# Patient Record
Sex: Female | Born: 1935 | Race: White | Hispanic: No | Marital: Married
Health system: Midwestern US, Community
[De-identification: ages and names within clinical notes are randomized; demographics above are authoritative.]

## PROBLEM LIST (undated history)

## (undated) DIAGNOSIS — F419 Anxiety disorder, unspecified: Secondary | ICD-10-CM

## (undated) DIAGNOSIS — G4700 Insomnia, unspecified: Secondary | ICD-10-CM

## (undated) DIAGNOSIS — F323 Major depressive disorder, single episode, severe with psychotic features: Secondary | ICD-10-CM

## (undated) DIAGNOSIS — F0390 Unspecified dementia without behavioral disturbance: Secondary | ICD-10-CM

## (undated) DIAGNOSIS — G40909 Epilepsy, unspecified, not intractable, without status epilepticus: Secondary | ICD-10-CM

---

## 2013-08-23 ENCOUNTER — Inpatient Hospital Stay
Admit: 2013-08-23 | Discharge: 2013-08-23 | Disposition: A | Discharge status: Home / Self Care | Attending: Emergency Medicine

## 2013-08-23 LAB — TROPONIN
Troponin I: <0.015 ng/mL (ref 0.000–0.045)
Troponin I: <0.015 ng/mL (ref 0.000–0.045)

## 2013-08-23 LAB — BASIC METABOLIC PANEL
Anion Gap: 11
BUN: 12 mg/dL (ref 7–25)
CO2: 25 mmol/L
Calcium: 9 mg/dL (ref 8.2–10.1)
Chloride: 91 mmol/L — ABNORMAL LOW (ref 98–109)
Creatinine: 1 mg/dL (ref 0.60–1.50)
EGFR IF NonAfrican American: 52.5 mL/min (ref >60)
Glucose: 106 mg/dL — ABNORMAL HIGH (ref 70–100)
Potassium: 3 mmol/L — ABNORMAL LOW (ref 3.5–5.1)
Sodium: 127 mmol/L — ABNORMAL LOW (ref 135–145)
eGFR African American: >60 mL/min (ref >60)

## 2013-08-23 LAB — CK-MB
CK-MB: 1.3 ng/mL (ref 0.5–3.6)
CK-MB: 1.4 ng/mL (ref 0.5–3.6)
Relative Index: 1.2 (ref 0.0–4.0)
Relative Index: 1.2 (ref 0.0–4.0)
Total CK: 113 U/L (ref 26–192)
Total CK: 115 U/L (ref 26–192)

## 2013-08-23 LAB — CBC
Hematocrit: 34.7 % — ABNORMAL LOW (ref 35.0–47.0)
Hemoglobin: 11.6 g/dl — ABNORMAL LOW (ref 11.7–16.0)
MCH: 32 pg (ref 26.0–34.0)
MCHC: 33.6 % (ref 32.0–36.0)
MCV: 95.3 fl (ref 79.0–98.0)
MPV: 7.4 fl (ref 7.4–10.4)
Platelets: 205 10*3/uL (ref 140–440)
RBC: 3.64 10*6/uL — ABNORMAL LOW (ref 3.80–5.20)
RDW: 12.9 % (ref 11.5–14.5)
WBC: 6 10*3/uL (ref 3.6–10.7)

## 2013-08-23 NOTE — ED Provider Notes (Signed)
PATIENT:          Angela Levine, Angela Levine        DOS:           08/23/2013  MR #:             [id number]             ACCOUNT #:     [id number]  DATE OF BIRTH:    Feb 13, 1926              AGE:           80      PROBLEM LIST:     chest pain: Entered Date: 23-Aug-2013 00:19, Entered By:  Carin HockINTERFACES,  INTERFACES    HISTORY OF PRESENT ILLNESS:    PERTINENT HISTORY OF PRESENT  ILLNESS. Patient seen with Dr.  Mindi Junkerussel.    Patient is an 80 year old woman with history of dementia who presents  with  chest pain this evening.  Per daughter, patient was complaining of  chest pain  and dyspnea.  She currently is symptom-free.  Daughter has not noticed  a fever,  vomiting, diarrhea.  She is concerned that patient has had decreased  appetite  and has lost several pounds in the last 2 weeks.  Of note, patient's  husband  recently passed away.    PERTINENT PAST/ FAMILY/SOCIAL HISTORY PMH: Dementia, hypertension,  hyperlipidemia, congestive heart failure  PSH: Last stress test in 2014, negative, hysterectomy,  cholecystectomy      PHYSICAL EXAM Vital signs unremarkable  General: No acute distress  HEENT: Neck supple, no JVD, nontender  Chest: Heart regular rate and rhythm, lungs clear to auscultation  bilaterally,  chest nontender  Abdomen: Soft, nontender, nondistended, no masses palpated  Back: Nontender, no CVA tenderness  Extremities: 2+ distal pulses, symmetric 1+ edema  Neurologic: Alert and oriented to self only, baseline, no focal  deficits    MEDICAL DECISION MAKING:    SIGNIFICANT FINDINGS/ED COURSE/MEDICAL DECISION MAKING/TREATMENT  PLAN Patient  with history of dementia and multiple comorbidities presents with  transient  chest pain.  Daughter gave her 324 mg of aspirin at home.  Electrocardiogram  showed sinus bradycardia with left ventricular hypertrophy and ST  changes that  were seen previously.  Lab work was significant for hemoglobin of  11.6, sodium  of 137, potassium of 3.  Troponin was negative.  Chest x-ray  appeared  unchanged  from previous.  On reevaluation, patient was pain-free.  Patient was  discussed  with Dr. Ladona Ridgelaylor, who knows her well.  It was determined that we would  check a  delta troponin and send her home with close followup.  Repeat cardiac  enzymes  were negative.  Patient's daughter is comfortable taking her home.  She was  counseled on symptoms that should prompt return.      DIAGNOSIS Chest pain, resolved  History of congestive heart failure      ADDITIONAL INFORMATION The Emergency Medicine attending physician  was present  in the Emergency Department, who reviewed case management, and  approved  evaluation/treatment.    COPIES SENT TO::     Ladona RidgelAYLOR, MATTHEW(PCP): [id number]    Electronic Signatures:  Farrel GordonUSSEL, Angie Hogg (MD)  (Signed 23-Aug-2013 06:16)   Co-Signer: PROBLEM LIST, HISTORY OF PRESENT ILLNESS, PHYSICAL EXAM,  MEDICAL  DECISION MAKING, DIAGNOSIS, Additional Infomation, Copies to be sent  to:  Milagros LollPALUMBO, CATERINA G (MD)  (Signed 23-Aug-2013 05:17)   Authored: PROBLEM LIST, HISTORY OF PRESENT ILLNESS, PHYSICAL EXAM,  MEDICAL  DECISION MAKING, DIAGNOSIS, Additional Infomation, Copies to be sent  to:      Last Updated: 23-Aug-2013 06:16 by Farrel GordonUSSEL, Estaban [place] (MD)            Please see T-Sheet, initial assessment, and physician orders for  further details.    Dictating Physician: Consuela Mimesaterina Palumbo, MD  Original Electronic Signature Date: 08/23/2013 03:36 A  CP  Document #: [id number]    cc:  Cleon DewMatthew S Taylor, MD       [phone]       [address]       [place] [id number]

## 2013-10-07 ENCOUNTER — Emergency Department: Payer: Self-pay | Admitting: Emergency Medicine

## 2013-12-05 ENCOUNTER — Emergency Department: Payer: Self-pay | Admitting: Emergency Medicine

## 2013-12-05 LAB — CBC
HCT: 35.1 % (ref 35.0–47.0)
HGB: 11.1 g/dL — AB (ref 12.0–16.0)
MCH: 30.6 pg (ref 26.0–34.0)
MCHC: 31.5 g/dL — ABNORMAL LOW (ref 32.0–36.0)
MCV: 97 fL (ref 80–100)
Platelet: 222 10*3/uL (ref 150–440)
RBC: 3.61 10*6/uL — ABNORMAL LOW (ref 3.80–5.20)
RDW: 13.6 % (ref 11.5–14.5)
WBC: 5.6 10*3/uL (ref 3.6–11.0)

## 2013-12-05 LAB — URINALYSIS, COMPLETE
Bilirubin,UR: NEGATIVE
Blood: NEGATIVE
Glucose,UR: NEGATIVE mg/dL (ref 0–75)
Hyaline Cast: 3
KETONE: NEGATIVE
LEUKOCYTE ESTERASE: NEGATIVE
Nitrite: NEGATIVE
PROTEIN: NEGATIVE
Ph: 6 (ref 4.5–8.0)
Specific Gravity: 1.005 (ref 1.003–1.030)
Squamous Epithelial: NONE SEEN
WBC UR: 11 /HPF (ref 0–5)

## 2013-12-05 LAB — COMPREHENSIVE METABOLIC PANEL
ALBUMIN: 3.6 g/dL (ref 3.4–5.0)
ALK PHOS: 70 U/L
AST: 21 U/L (ref 15–37)
Anion Gap: 9 (ref 7–16)
BILIRUBIN TOTAL: 0.5 mg/dL (ref 0.2–1.0)
BUN: 17 mg/dL (ref 7–18)
CO2: 26 mmol/L (ref 21–32)
CREATININE: 0.99 mg/dL (ref 0.60–1.30)
Calcium, Total: 8.9 mg/dL (ref 8.5–10.1)
Chloride: 104 mmol/L (ref 98–107)
EGFR (Non-African Amer.): 57 — ABNORMAL LOW
Glucose: 83 mg/dL (ref 65–99)
Osmolality: 278 (ref 275–301)
POTASSIUM: 3.2 mmol/L — AB (ref 3.5–5.1)
SGPT (ALT): 24 U/L
Sodium: 139 mmol/L (ref 136–145)
Total Protein: 6.6 g/dL (ref 6.4–8.2)

## 2013-12-05 LAB — TROPONIN I: Troponin-I: <0.02 ng/mL

## 2014-01-02 ENCOUNTER — Emergency Department: Payer: Self-pay | Admitting: Emergency Medicine

## 2014-02-05 ENCOUNTER — Emergency Department: Payer: Self-pay | Admitting: Emergency Medicine

## 2014-02-05 LAB — COMPREHENSIVE METABOLIC PANEL
ALT: 30 U/L
ANION GAP: 6 — AB (ref 7–16)
Albumin: 3 g/dL — ABNORMAL LOW (ref 3.4–5.0)
Alkaline Phosphatase: 68 U/L
BILIRUBIN TOTAL: 0.4 mg/dL (ref 0.2–1.0)
BUN: 19 mg/dL — AB (ref 7–18)
CHLORIDE: 105 mmol/L (ref 98–107)
CO2: 28 mmol/L (ref 21–32)
Calcium, Total: 8.6 mg/dL (ref 8.5–10.1)
Creatinine: 0.99 mg/dL (ref 0.60–1.30)
EGFR (African American): >60
EGFR (Non-African Amer.): 57 — ABNORMAL LOW
Glucose: 91 mg/dL (ref 65–99)
Osmolality: 279 (ref 275–301)
Potassium: 4.2 mmol/L (ref 3.5–5.1)
SGOT(AST): 47 U/L — ABNORMAL HIGH (ref 15–37)
Sodium: 139 mmol/L (ref 136–145)
Total Protein: 6.2 g/dL — ABNORMAL LOW (ref 6.4–8.2)

## 2014-02-05 LAB — CBC WITH DIFFERENTIAL/PLATELET
BASOS ABS: 0 10*3/uL (ref 0.0–0.1)
BASOS PCT: 0.6 %
EOS ABS: 0 10*3/uL (ref 0.0–0.7)
EOS PCT: 0.8 %
HCT: 32.5 % — AB (ref 35.0–47.0)
HGB: 10.4 g/dL — ABNORMAL LOW (ref 12.0–16.0)
LYMPHS ABS: 0.7 10*3/uL — AB (ref 1.0–3.6)
Lymphocyte %: 13.9 %
MCH: 30.8 pg (ref 26.0–34.0)
MCHC: 32.1 g/dL (ref 32.0–36.0)
MCV: 96 fL (ref 80–100)
Monocyte #: 0.5 x10 3/mm (ref 0.2–0.9)
Monocyte %: 9.7 %
Neutrophil #: 3.9 10*3/uL (ref 1.4–6.5)
Neutrophil %: 75 %
PLATELETS: 223 10*3/uL (ref 150–440)
RBC: 3.38 10*6/uL — ABNORMAL LOW (ref 3.80–5.20)
RDW: 14.4 % (ref 11.5–14.5)
WBC: 5.2 10*3/uL (ref 3.6–11.0)

## 2014-02-11 ENCOUNTER — Emergency Department: Payer: Self-pay | Admitting: Emergency Medicine

## 2014-02-11 LAB — URINALYSIS, COMPLETE
BLOOD: NEGATIVE
Bilirubin,UR: NEGATIVE
Glucose,UR: NEGATIVE mg/dL (ref 0–75)
KETONE: NEGATIVE
NITRITE: NEGATIVE
PH: 5 (ref 4.5–8.0)
RBC,UR: <1 /HPF (ref 0–5)
SQUAMOUS EPITHELIAL: NONE SEEN
Specific Gravity: 1.016 (ref 1.003–1.030)
WBC UR: 18 /HPF (ref 0–5)

## 2014-02-14 LAB — URINE CULTURE

## 2014-03-01 ENCOUNTER — Emergency Department: Payer: Self-pay | Admitting: Emergency Medicine

## 2014-03-01 LAB — CBC
HCT: 33.5 % — ABNORMAL LOW
HGB: 11 g/dL — ABNORMAL LOW
MCH: 31.4 pg
MCHC: 32.8 g/dL
MCV: 96 fL
Platelet: 194 10*3/uL
RBC: 3.5 X10 6/mm 3 — ABNORMAL LOW
RDW: 15.1 % — ABNORMAL HIGH
WBC: 4.9 10*3/uL

## 2014-03-01 LAB — COMPREHENSIVE METABOLIC PANEL
Albumin: 3.3 g/dL — ABNORMAL LOW (ref 3.4–5.0)
Alkaline Phosphatase: 77 U/L
Anion Gap: 7 (ref 7–16)
BILIRUBIN TOTAL: 0.5 mg/dL (ref 0.2–1.0)
BUN: 22 mg/dL — ABNORMAL HIGH (ref 7–18)
Calcium, Total: 8.9 mg/dL (ref 8.5–10.1)
Chloride: 104 mmol/L (ref 98–107)
Co2: 29 mmol/L (ref 21–32)
Creatinine: 1.01 mg/dL (ref 0.60–1.30)
EGFR (Non-African Amer.): 55 — ABNORMAL LOW
GLUCOSE: 96 mg/dL (ref 65–99)
Osmolality: 283 (ref 275–301)
Potassium: 3.6 mmol/L (ref 3.5–5.1)
SGOT(AST): 32 U/L (ref 15–37)
SGPT (ALT): 28 U/L
Sodium: 140 mmol/L (ref 136–145)
TOTAL PROTEIN: 6.4 g/dL (ref 6.4–8.2)

## 2014-10-27 ENCOUNTER — Encounter: Payer: Self-pay | Admitting: Emergency Medicine

## 2014-10-27 ENCOUNTER — Emergency Department: Payer: Medicare Other

## 2014-10-27 ENCOUNTER — Inpatient Hospital Stay
Admission: EM | Admit: 2014-10-27 | Discharge: 2014-10-31 | DRG: 481 | Disposition: A | Discharge status: Skilled Nursing Facility | Payer: Medicare Other | Attending: Internal Medicine | Admitting: Internal Medicine

## 2014-10-27 DIAGNOSIS — F0280 Dementia in other diseases classified elsewhere without behavioral disturbance: Secondary | ICD-10-CM | POA: Diagnosis present

## 2014-10-27 DIAGNOSIS — D62 Acute posthemorrhagic anemia: Secondary | ICD-10-CM | POA: Diagnosis not present

## 2014-10-27 DIAGNOSIS — S72009A Fracture of unspecified part of neck of unspecified femur, initial encounter for closed fracture: Secondary | ICD-10-CM

## 2014-10-27 DIAGNOSIS — Z880 Allergy status to penicillin: Secondary | ICD-10-CM | POA: Diagnosis not present

## 2014-10-27 DIAGNOSIS — R339 Retention of urine, unspecified: Secondary | ICD-10-CM | POA: Diagnosis not present

## 2014-10-27 DIAGNOSIS — M25551 Pain in right hip: Secondary | ICD-10-CM | POA: Diagnosis present

## 2014-10-27 DIAGNOSIS — Z7982 Long term (current) use of aspirin: Secondary | ICD-10-CM

## 2014-10-27 DIAGNOSIS — G4700 Insomnia, unspecified: Secondary | ICD-10-CM | POA: Diagnosis present

## 2014-10-27 DIAGNOSIS — G40909 Epilepsy, unspecified, not intractable, without status epilepticus: Secondary | ICD-10-CM | POA: Diagnosis present

## 2014-10-27 DIAGNOSIS — F29 Unspecified psychosis not due to a substance or known physiological condition: Secondary | ICD-10-CM | POA: Diagnosis present

## 2014-10-27 DIAGNOSIS — F419 Anxiety disorder, unspecified: Secondary | ICD-10-CM | POA: Diagnosis present

## 2014-10-27 DIAGNOSIS — R7989 Other specified abnormal findings of blood chemistry: Secondary | ICD-10-CM | POA: Diagnosis not present

## 2014-10-27 DIAGNOSIS — S72001A Fracture of unspecified part of neck of right femur, initial encounter for closed fracture: Secondary | ICD-10-CM

## 2014-10-27 DIAGNOSIS — I10 Essential (primary) hypertension: Secondary | ICD-10-CM | POA: Diagnosis present

## 2014-10-27 DIAGNOSIS — Z79899 Other long term (current) drug therapy: Secondary | ICD-10-CM

## 2014-10-27 DIAGNOSIS — W010XXA Fall on same level from slipping, tripping and stumbling without subsequent striking against object, initial encounter: Secondary | ICD-10-CM | POA: Diagnosis present

## 2014-10-27 DIAGNOSIS — G309 Alzheimer's disease, unspecified: Secondary | ICD-10-CM | POA: Diagnosis present

## 2014-10-27 DIAGNOSIS — F329 Major depressive disorder, single episode, unspecified: Secondary | ICD-10-CM | POA: Diagnosis present

## 2014-10-27 DIAGNOSIS — E876 Hypokalemia: Secondary | ICD-10-CM | POA: Diagnosis not present

## 2014-10-27 DIAGNOSIS — R748 Abnormal levels of other serum enzymes: Secondary | ICD-10-CM | POA: Diagnosis not present

## 2014-10-27 DIAGNOSIS — S72141A Displaced intertrochanteric fracture of right femur, initial encounter for closed fracture: Secondary | ICD-10-CM | POA: Diagnosis present

## 2014-10-27 HISTORY — DX: Insomnia, unspecified: G47.00

## 2014-10-27 HISTORY — DX: Major depressive disorder, single episode, severe with psychotic features: F32.3

## 2014-10-27 HISTORY — DX: Anxiety disorder, unspecified: F41.9

## 2014-10-27 HISTORY — DX: Epilepsy, unspecified, not intractable, without status epilepticus: G40.909

## 2014-10-27 HISTORY — DX: Unspecified dementia, unspecified severity, without behavioral disturbance, psychotic disturbance, mood disturbance, and anxiety: F03.90

## 2014-10-27 LAB — BASIC METABOLIC PANEL
ANION GAP: 7 (ref 5–15)
BUN: 17 mg/dL (ref 6–20)
CO2: 28 mmol/L (ref 22–32)
Calcium: 8.9 mg/dL (ref 8.9–10.3)
Chloride: 102 mmol/L (ref 101–111)
Creatinine, Ser: 0.91 mg/dL (ref 0.44–1.00)
GFR, EST NON AFRICAN AMERICAN: 55 mL/min — AB (ref >60)
Glucose, Bld: 141 mg/dL — ABNORMAL HIGH (ref 65–99)
POTASSIUM: 3.6 mmol/L (ref 3.5–5.1)
SODIUM: 137 mmol/L (ref 135–145)

## 2014-10-27 LAB — PROTIME-INR
INR: 1
PROTHROMBIN TIME: 13.4 s (ref 11.4–15.0)

## 2014-10-27 LAB — CBC WITH DIFFERENTIAL/PLATELET
BASOS ABS: 0 10*3/uL (ref 0–0.1)
Basophils Relative: 0 %
EOS ABS: 0 10*3/uL (ref 0–0.7)
EOS PCT: 1 %
HCT: 32.2 % — ABNORMAL LOW (ref 35.0–47.0)
Hemoglobin: 10.7 g/dL — ABNORMAL LOW (ref 12.0–16.0)
LYMPHS PCT: 5 %
Lymphs Abs: 0.4 10*3/uL — ABNORMAL LOW (ref 1.0–3.6)
MCH: 31.8 pg (ref 26.0–34.0)
MCHC: 33.1 g/dL (ref 32.0–36.0)
MCV: 96 fL (ref 80.0–100.0)
Monocytes Absolute: 0.5 10*3/uL (ref 0.2–0.9)
Monocytes Relative: 7 %
Neutro Abs: 6.7 10*3/uL — ABNORMAL HIGH (ref 1.4–6.5)
Neutrophils Relative %: 87 %
PLATELETS: 148 10*3/uL — AB (ref 150–440)
RBC: 3.36 MIL/uL — AB (ref 3.80–5.20)
RDW: 13.5 % (ref 11.5–14.5)
WBC: 7.7 10*3/uL (ref 3.6–11.0)

## 2014-10-27 LAB — APTT: APTT: 26 s (ref 24–36)

## 2014-10-27 LAB — TROPONIN I

## 2014-10-27 MED ORDER — ONDANSETRON HCL 4 MG PO TABS: 4.0000 mg | ORAL_TABLET | Freq: Four times a day (QID) | ORAL | Status: DC | PRN

## 2014-10-27 MED ORDER — POLYETHYLENE GLYCOL 3350 17 G PO PACK: 17.0000 g | PACK | Freq: Every day | ORAL | Status: DC | PRN

## 2014-10-27 MED ORDER — ACETAMINOPHEN 650 MG RE SUPP: 650.0000 mg | Freq: Four times a day (QID) | RECTAL | Status: DC | PRN

## 2014-10-27 MED ORDER — LORAZEPAM 0.5 MG PO TABS
0.5000 mg | ORAL_TABLET | Freq: Two times a day (BID) | ORAL | Status: DC
  Administered 2014-10-28 – 2014-10-31 (×7): 0.5 mg via ORAL
  Filled 2014-10-27 (×7): qty 1

## 2014-10-27 MED ORDER — ACETAMINOPHEN 325 MG PO TABS
650.0000 mg | ORAL_TABLET | Freq: Four times a day (QID) | ORAL | Status: DC | PRN
  Administered 2014-10-29: 650 mg via ORAL
  Filled 2014-10-27: qty 2

## 2014-10-27 MED ORDER — SODIUM CHLORIDE 0.9 % IV SOLN
INTRAVENOUS | Status: DC
  Administered 2014-10-27 – 2014-10-28 (×3): via INTRAVENOUS

## 2014-10-27 MED ORDER — RISPERIDONE 0.5 MG PO TABS
0.2500 mg | ORAL_TABLET | Freq: Two times a day (BID) | ORAL | Status: DC
  Administered 2014-10-28 – 2014-10-31 (×7): 0.25 mg via ORAL
  Filled 2014-10-27 (×10): qty 1

## 2014-10-27 MED ORDER — PRAVASTATIN SODIUM 20 MG PO TABS
40.0000 mg | ORAL_TABLET | Freq: Every day | ORAL | Status: DC
  Administered 2014-10-28 – 2014-10-30 (×4): 40 mg via ORAL
  Filled 2014-10-27 (×4): qty 2

## 2014-10-27 MED ORDER — MORPHINE SULFATE (PF) 4 MG/ML IV SOLN
4.0000 mg | Freq: Once | INTRAVENOUS | Status: AC
  Administered 2014-10-27: 4 mg via INTRAVENOUS
  Filled 2014-10-27: qty 1

## 2014-10-27 MED ORDER — MIRTAZAPINE 15 MG PO TABS
7.5000 mg | ORAL_TABLET | Freq: Every day | ORAL | Status: DC
  Administered 2014-10-28 – 2014-10-30 (×4): 7.5 mg via ORAL
  Filled 2014-10-27 (×4): qty 1

## 2014-10-27 MED ORDER — MELATONIN 3 MG PO TABS: 3.0000 mg | ORAL_TABLET | Freq: Every day | ORAL | Status: DC

## 2014-10-27 MED ORDER — VITAMIN D 1000 UNITS PO TABS
2000.0000 [IU] | ORAL_TABLET | Freq: Every day | ORAL | Status: DC
  Administered 2014-10-29 – 2014-10-31 (×3): 2000 [IU] via ORAL
  Filled 2014-10-27 (×3): qty 2

## 2014-10-27 MED ORDER — MORPHINE SULFATE (PF) 2 MG/ML IV SOLN
2.0000 mg | INTRAVENOUS | Status: DC | PRN
  Administered 2014-10-28 (×4): 2 mg via INTRAVENOUS
  Filled 2014-10-27 (×4): qty 1

## 2014-10-27 MED ORDER — DOCUSATE SODIUM 100 MG PO CAPS
100.0000 mg | ORAL_CAPSULE | Freq: Two times a day (BID) | ORAL | Status: DC
  Administered 2014-10-27 – 2014-10-29 (×3): 100 mg via ORAL
  Filled 2014-10-27 (×3): qty 1

## 2014-10-27 MED ORDER — SODIUM CHLORIDE 0.9 % IV BOLUS (SEPSIS)
250.0000 mL | Freq: Once | INTRAVENOUS | Status: AC
  Administered 2014-10-27: 250 mL via INTRAVENOUS

## 2014-10-27 MED ORDER — DIVALPROEX SODIUM 125 MG PO CSDR
125.0000 mg | DELAYED_RELEASE_CAPSULE | Freq: Every day | ORAL | Status: DC
  Administered 2014-10-29 – 2014-10-31 (×3): 125 mg via ORAL
  Filled 2014-10-27 (×3): qty 1

## 2014-10-27 MED ORDER — VITAMIN C 500 MG PO TABS
500.0000 mg | ORAL_TABLET | Freq: Every day | ORAL | Status: DC
  Administered 2014-10-29 – 2014-10-31 (×3): 500 mg via ORAL
  Filled 2014-10-27 (×3): qty 1

## 2014-10-27 MED ORDER — OXYCODONE HCL 5 MG PO TABS
5.0000 mg | ORAL_TABLET | ORAL | Status: DC | PRN
  Administered 2014-10-28: 5 mg via ORAL
  Filled 2014-10-27: qty 1

## 2014-10-27 MED ORDER — ONDANSETRON HCL 4 MG/2ML IJ SOLN
4.0000 mg | Freq: Four times a day (QID) | INTRAMUSCULAR | Status: DC | PRN
  Administered 2014-10-28: 4 mg via INTRAVENOUS

## 2014-10-27 MED ORDER — DIVALPROEX SODIUM 125 MG PO CSDR
250.0000 mg | DELAYED_RELEASE_CAPSULE | Freq: Every day | ORAL | Status: DC
  Administered 2014-10-28 – 2014-10-30 (×4): 250 mg via ORAL
  Filled 2014-10-27 (×4): qty 2

## 2014-10-27 MED ORDER — ONDANSETRON HCL 4 MG/2ML IJ SOLN
4.0000 mg | Freq: Once | INTRAMUSCULAR | Status: AC
  Administered 2014-10-27: 4 mg via INTRAVENOUS
  Filled 2014-10-27: qty 2

## 2014-10-27 NOTE — H&P (Signed)
[facility] [place] [facility]   PATIENT NAME: Kristen Deleon    MR#:  [id number]  DATE OF BIRTH:  [birth year]   DATE OF ADMISSION:  10/27/2014  PRIMARY CARE PHYSICIAN: Danella Penton., MD   REQUESTING/REFERRING PHYSICIAN: Schaevitz  CHIEF COMPLAINT:   Chief Complaint  Patient presents with  . Fall    HISTORY OF PRESENT ILLNESS:  Kristen Deleon  is a 81 y.o. female with a known history of dementia presenting after mechanical fall. She is unable to provide reliable information given mental status/medical condition. History obtained from emergency department staff. She fell at her nursing facility [facility] while she was in the bathroom. Fortunately, she had no head trauma or loss of consciousness. She did have immediate pain and right hip described only as "pain" she was found to have a right intertrochanteric hip fracture. Once again unable to provide meaningful information given mental status  PAST MEDICAL HISTORY:   Past Medical History  Diagnosis Date  . Dementia   . Depressive psychosis   . Anxiety   . Insomnia   . Epilepsia     PAST SURGICAL HISTORY:  History reviewed. No pertinent past surgical history.  SOCIAL HISTORY:   Social History  Substance Use Topics  . Smoking status: Never Smoker   . Smokeless tobacco: Not on file  . Alcohol Use: No    FAMILY HISTORY:   Family History  Problem Relation Age of Onset  . Family history unknown: Yes   unable to obtain given patient's mental status  DRUG ALLERGIES:   Allergies  Allergen Reactions  . Penicillins Other (See Comments)    Unknown      REVIEW OF SYSTEMS:   Unreliable given patient mental status/medical condition    MEDICATIONS AT HOME:   Prior to Admission medications   Not on File      VITAL SIGNS:  Blood pressure 154/67, pulse 70, temperature 98.5 F (36.9 C), temperature source Oral, resp. rate 16, height 5\' 4"  (1.626 m), weight 106 lb 7.7 oz (48.3  kg), SpO2 97 %.  PHYSICAL EXAMINATION:  VITAL SIGNS: Filed Vitals:   10/27/14 2008  BP: 154/67  Pulse: 70  Temp: 98.5 F (36.9 C)  Resp: 16   GENERAL:81 y.o.female currently in minimal acute distress. Given pain HEAD: Normocephalic, atraumatic.  EYES: Pupils equal, round, reactive to light. Extraocular muscles intact. No scleral icterus.  MOUTH: Moist mucosal membrane. Dentition intact. No abscess noted.  EAR, NOSE, THROAT: Clear without exudates. No external lesions.  NECK: Supple. No thyromegaly. No nodules. No JVD.  PULMONARY: Clear to ascultation, without wheeze rails or rhonci. No use of accessory muscles, Good respiratory effort. good air entry bilaterally CHEST: Nontender to palpation.  CARDIOVASCULAR: S1 and S2. Regular rate and rhythm. No murmurs, rubs, or gallops. No edema. Pedal pulses 2+ bilaterally.  GASTROINTESTINAL: Soft, nontender, nondistended. No masses. Positive bowel sounds. No hepatosplenomegaly.  MUSCULOSKELETAL: No swelling, clubbing, or edema. Limited Range of motion right lower extremity given fracture, extremity rotated, shortened  NEUROLOGIC: Cranial nerves II through XII are intact. No gross focal neurological deficits. Sensation intact. Reflexes intact.  SKIN: No ulceration, lesions, rashes, or cyanosis. Skin warm and dry. Turgor intact.  PSYCHIATRIC: Mood, affect flattened. The patient is awake, alert oriented to self however pleasantly conversant. Insight, judgment poor.    LABORATORY PANEL:   CBC  Recent Labs Lab 10/27/14 2032  WBC 7.7  HGB 10.7*  HCT 32.2*  PLT 148*   ------------------------------------------------------------------------------------------------------------------  Chemistries   Recent Labs Lab 10/27/14 2032  NA 137  K 3.6  CL 102  CO2 28  GLUCOSE 141*  BUN 17  CREATININE 0.91  CALCIUM 8.9    ------------------------------------------------------------------------------------------------------------------  Cardiac Enzymes  Recent Labs Lab 10/27/14 2032  TROPONINI <0.03   ------------------------------------------------------------------------------------------------------------------  RADIOLOGY:  Dg Chest 1 View  10/27/2014   CLINICAL DATA:  Status post 2 falls, with concern for chest injury. Initial encounter.  EXAM: CHEST  1 VIEW  COMPARISON:  Chest radiograph performed 02/11/2014  FINDINGS: The lungs are well-aerated. Vascular congestion is noted, with chronically increased interstitial markings. There is no evidence of pleural effusion or pneumothorax.  The cardiomediastinal silhouette is mildly enlarged. No acute osseous abnormalities are seen.  IMPRESSION: Vascular congestion and mild cardiomegaly, with chronically increased interstitial markings. No displaced rib fracture seen.   Electronically Signed   By: Roanna Raider M.D.   On: 10/27/2014 21:13   Ct Head Wo Contrast  10/27/2014   CLINICAL DATA:  Patient fell at nursing home twice today dementia  EXAM: CT HEAD WITHOUT CONTRAST  CT CERVICAL SPINE WITHOUT CONTRAST  TECHNIQUE: Multidetector CT imaging of the head and cervical spine was performed following the standard protocol without intravenous contrast. Multiplanar CT image reconstructions of the cervical spine were also generated.  COMPARISON:  03/01/2014  FINDINGS: CT HEAD FINDINGS  Stable hyperostosis of the calvarium. Severe diffuse atrophy. No hemorrhage infarct mass hydrocephalus or extra-axial fluid. No skull fracture.  CT CERVICAL SPINE FINDINGS  No soft tissue abnormalities. Normal alignment. Mild degenerative disc disease throughout the cervical spine. No fracture. Lung apices clear.  IMPRESSION: No acute abnormalities.  Chronic intracranial atrophy.  No evidence of cervical spine fracture.   Electronically Signed   By: Esperanza Heir M.D.   On: 10/27/2014 21:26    Ct Cervical Spine Wo Contrast  10/27/2014   CLINICAL DATA:  Patient fell at nursing home twice today dementia  EXAM: CT HEAD WITHOUT CONTRAST  CT CERVICAL SPINE WITHOUT CONTRAST  TECHNIQUE: Multidetector CT imaging of the head and cervical spine was performed following the standard protocol without intravenous contrast. Multiplanar CT image reconstructions of the cervical spine were also generated.  COMPARISON:  03/01/2014  FINDINGS: CT HEAD FINDINGS  Stable hyperostosis of the calvarium. Severe diffuse atrophy. No hemorrhage infarct mass hydrocephalus or extra-axial fluid. No skull fracture.  CT CERVICAL SPINE FINDINGS  No soft tissue abnormalities. Normal alignment. Mild degenerative disc disease throughout the cervical spine. No fracture. Lung apices clear.  IMPRESSION: No acute abnormalities.  Chronic intracranial atrophy.  No evidence of cervical spine fracture.   Electronically Signed   By: Esperanza Heir M.D.   On: 10/27/2014 21:26   Dg Hip Unilat With Pelvis 2-3 Views Right  10/27/2014   CLINICAL DATA:  Larey Seat twice today at her nursing home.  EXAM: DG HIP (WITH OR WITHOUT PELVIS) 2-3V RIGHT  COMPARISON:  None.  FINDINGS: There is an intertrochanteric right hip fracture with mild comminution and moderate varus angulation. There is no dislocation.  IMPRESSION: Intertrochanteric right hip fracture   Electronically Signed   By: Ellery Plunk M.D.   On: 10/27/2014 21:13    EKG:   Orders placed or performed during the hospital encounter of 10/27/14  . ED EKG  . ED EKG    IMPRESSION AND PLAN:   81 year old Caucasian female history of dementia presenting after mechanical fall.  1. Preoperative evaluation for right intertrochanteric hip fracture: She should be considered a moderate risk for  moderate risk surgery from the cardiac standpoint. No active signs or symptoms suggestive of congestive heart failure, significant valvular dysfunction, significant arrhythmia. Her METs to be considered  less than 4 given generalized dismobility. No further testing or interventions required prior to surgery. Place nothing by mouth past midnight, provide pain medications, initiate bowel regimen 2. Epilepsy: Continue with Depakote 3. Venous thromboembolism prophylactic: SCDs    All the records are reviewed and case discussed with ED provider. Management plans discussed with the patient, family and they are in agreement.  CODE STATUS: Full  TOTAL TIME TAKING CARE OF THIS PATIENT: 35 minutes.    Hower,  Mardi Mainland.D on 10/27/2014 at 9:53 PM  Between 7am to 6pm - Pager - [phone]  After 6pm: House Pager: - [phone]  [facility]  Office  [phone]  CC: Primary care physician; Danella Penton., MD

## 2014-10-27 NOTE — ED Notes (Signed)
Pt presents to ED via EMS from [facility] with c/o of acute fall, with right hip pain. EMS states pt had x2 fall episodes today. EMS states pt was located in bathroom when pt experienced presenting sx. Pt arrived to ER with notable right side shortened leg, externally rotated. EMS states pt has an extensive hx of dementia, with behavior disturbance. Pt arrived to ER rating pain a 10/10 to affected side. MD at bedside, completing medical evaluation.

## 2014-10-27 NOTE — ED Provider Notes (Signed)
[facility] Emergency Department Provider Note  ____________________________________________  Time seen: Seen upon arrival to the emergency department   I have reviewed the triage vital signs and the nursing notes.   HISTORY  Chief Complaint Fall    HPI Kristen Deleon is a 81 y.o. female with a history of dementia from a skilled nursing facility who presents today with a fall 2 today. Unclear if mechanical. Patient does not recall specifics of the fall but says she tripped and fell in her bathroom. However, does report right-sided hip pain. EMS was only able to obtain a limited history from the patient's nursing home. Unclear if loss of consciousness or any head trauma. Patient is denying pain or back or neck or head at this time.   Past Medical History  Diagnosis Date  . Dementia   . Depressive psychosis   . Anxiety   . Insomnia   . Epilepsia     There are no active problems to display for this patient.   History reviewed. No pertinent past surgical history.  No current outpatient prescriptions on file.  Allergies Penicillins  No family history on file.  Social History Social History  Substance Use Topics  . Smoking status: Never Smoker   . Smokeless tobacco: None  . Alcohol Use: No    Review of Systems Constitutional: No fever/chills Eyes: No visual changes. ENT: No sore throat. Cardiovascular: Denies chest pain. Respiratory: Denies shortness of breath. Gastrointestinal: No abdominal pain.  No nausea, no vomiting.  No diarrhea.  No constipation. Genitourinary: Negative for dysuria. Musculoskeletal: Negative for back pain. Skin: Negative for rash. Neurological: Negative for headaches, focal weakness or numbness.  10-point ROS otherwise negative.  ____________________________________________   PHYSICAL EXAM:  VITAL SIGNS: ED Triage Vitals  Enc Vitals Group     BP 10/27/14 2008 154/67 mmHg     Pulse Rate 10/27/14 2008 70     Resp 10/27/14 2008 16     Temp 10/27/14 2008 98.5 F (36.9 C)     Temp Source 10/27/14 2008 Oral     SpO2 10/27/14 2008 97 %     Weight 10/27/14 2008 106 lb 7.7 oz (48.3 kg)     Height 10/27/14 2008 5\' 4"  (1.626 m)     Head Cir --      Peak Flow --      Pain Score 10/27/14 2015 10     Pain Loc --      Pain Edu? --      Excl. in GC? --     Constitutional: Alert and oriented. Well appearing and in no acute distress. Eyes: Conjunctivae are normal. PERRL. EOMI. Head: Atraumatic. Nose: No congestion/rhinnorhea. Mouth/Throat: Mucous membranes are moist.  Oropharynx non-erythematous. Neck: No stridor.  No tenderness, step-off or deformity to the C-spine. Cardiovascular: Normal rate, regular rhythm. Grossly normal heart sounds.  Good peripheral circulation. Respiratory: Normal respiratory effort.  No retractions. Lungs CTAB. Gastrointestinal: Soft and nontender. No distention. No abdominal bruits. No CVA tenderness. Musculoskeletal: Right hip tenderness to palpation anteriorly and laterally. There is no overlying ecchymosis. The right leg is shortened and externally rotated. The patient is able to range her toes bilaterally and is sensate to the distal aspects of the extremities. There is also a intact bilateral and equal dorsalis pedis pulse. No tenderness to the thoracic or lumbar spines. Neurologic:  Normal speech and language. No gross focal neurologic deficits are appreciated. Skin:  Skin is warm, dry and intact. No rash noted. Psychiatric:  Mood and affect are normal. Speech and behavior are normal.  ____________________________________________   LABS (all labs ordered are listed, but only abnormal results are displayed)  Labs Reviewed  CBC WITH DIFFERENTIAL/PLATELET - Abnormal; Notable for the following:    RBC 3.36 (*)    Hemoglobin 10.7 (*)    HCT 32.2 (*)    Platelets 148 (*)    Neutro Abs 6.7 (*)    Lymphs Abs 0.4 (*)    All other components within normal limits  BASIC  METABOLIC PANEL  PROTIME-INR  APTT  TROPONIN I   ____________________________________________  EKG  ED ECG REPORT I, Arelia Longest, the attending physician, personally viewed and interpreted this ECG.   Date: 10/27/2014  EKG Time: 2026  Rate: 67  Rhythm: normal sinus rhythm  Axis: Left axis deviation  Intervals:Normal intervals  ST&T Change: Biphasic T-wave in aVL which is unchanged from previous. No ST elevations or depressions.  ____________________________________________  RADIOLOGY  CT head and C-spine without any acute findings. Right hip x-ray with intertrochanteric fracture. Vascular congestion and mild cardiomegaly on chest x-ray. I personally reviewed these images. ____________________________________________   PROCEDURES    ____________________________________________   INITIAL IMPRESSION / ASSESSMENT AND PLAN / ED COURSE  Pertinent labs & imaging results that were available during my care of the patient were reviewed by me and considered in my medical decision making (see chart for details).  ----------------------------------------- 9:49 PM on 10/27/2014 -----------------------------------------  Patient just received morphine. We'll reevaluate for reduction of pain. However, resting without distress at this time. Aware of right hip fracture. Discussed case with Dr. Clint Guy who will admit the patient to the hospital. Also discussed with Dr. Joice Lofts of orthopedics. ____________________________________________   FINAL CLINICAL IMPRESSION(S) / ED DIAGNOSES  Acute fall, likely mechanical. Acute right-sided hip fracture. Initial visit.    [name], MD 10/27/14 2150

## 2014-10-28 ENCOUNTER — Inpatient Hospital Stay: Payer: Medicare Other | Admitting: Anesthesiology

## 2014-10-28 ENCOUNTER — Encounter
Admission: EM | Disposition: A | Discharge status: Skilled Nursing Facility | Payer: Self-pay | Source: Home / Self Care | Attending: Internal Medicine

## 2014-10-28 ENCOUNTER — Inpatient Hospital Stay: Payer: Medicare Other

## 2014-10-28 HISTORY — PX: INTRAMEDULLARY (IM) NAIL INTERTROCHANTERIC: SHX5875

## 2014-10-28 LAB — CBC
HCT: 28 % — ABNORMAL LOW (ref 35.0–47.0)
Hemoglobin: 9.4 g/dL — ABNORMAL LOW (ref 12.0–16.0)
MCH: 31.8 pg (ref 26.0–34.0)
MCHC: 33.4 g/dL (ref 32.0–36.0)
MCV: 95.3 fL (ref 80.0–100.0)
PLATELETS: 141 10*3/uL — AB (ref 150–440)
RBC: 2.94 MIL/uL — ABNORMAL LOW (ref 3.80–5.20)
RDW: 13.7 % (ref 11.5–14.5)
WBC: 9.2 10*3/uL (ref 3.6–11.0)

## 2014-10-28 LAB — BASIC METABOLIC PANEL
Anion gap: 6 (ref 5–15)
BUN: 15 mg/dL (ref 6–20)
CALCIUM: 8.6 mg/dL — AB (ref 8.9–10.3)
CHLORIDE: 104 mmol/L (ref 101–111)
CO2: 27 mmol/L (ref 22–32)
CREATININE: 0.76 mg/dL (ref 0.44–1.00)
GFR calc Af Amer: >60 mL/min (ref >60)
GFR calc non Af Amer: >60 mL/min (ref >60)
GLUCOSE: 125 mg/dL — AB (ref 65–99)
Potassium: 3.8 mmol/L (ref 3.5–5.1)
Sodium: 137 mmol/L (ref 135–145)

## 2014-10-28 LAB — ABO/RH: ABO/RH(D): B POS

## 2014-10-28 LAB — MRSA PCR SCREENING: MRSA BY PCR: NEGATIVE

## 2014-10-28 SURGERY — FIXATION, FRACTURE, INTERTROCHANTERIC, WITH INTRAMEDULLARY ROD
Anesthesia: Regional | Laterality: Right

## 2014-10-28 MED ORDER — FENTANYL CITRATE (PF) 100 MCG/2ML IJ SOLN: 25.0000 ug | INTRAMUSCULAR | Status: DC | PRN

## 2014-10-28 MED ORDER — PROPOFOL 10 MG/ML IV BOLUS
INTRAVENOUS | Status: DC | PRN
  Administered 2014-10-28: 100 mg via INTRAVENOUS

## 2014-10-28 MED ORDER — SUCCINYLCHOLINE CHLORIDE 20 MG/ML IJ SOLN
INTRAMUSCULAR | Status: DC | PRN
Start: 2014-10-28 — End: 2014-10-28
  Administered 2014-10-28: 50 mg via INTRAVENOUS

## 2014-10-28 MED ORDER — MIDAZOLAM HCL 2 MG/2ML IJ SOLN
INTRAMUSCULAR | Status: DC | PRN
  Administered 2014-10-28: 1 mg via INTRAVENOUS

## 2014-10-28 MED ORDER — FENTANYL CITRATE (PF) 100 MCG/2ML IJ SOLN
INTRAMUSCULAR | Status: DC | PRN
Start: 2014-10-28 — End: 2014-10-28
  Administered 2014-10-28: 50 ug via INTRAVENOUS

## 2014-10-28 MED ORDER — NEOMYCIN-POLYMYXIN B GU 40-200000 IR SOLN
Status: DC | PRN
  Administered 2014-10-28: 2 mL

## 2014-10-28 MED ORDER — GLYCOPYRROLATE 0.2 MG/ML IJ SOLN: INTRAMUSCULAR | Status: DC | PRN

## 2014-10-28 MED ORDER — ONDANSETRON HCL 4 MG/2ML IJ SOLN: 4.0000 mg | Freq: Once | INTRAMUSCULAR | Status: DC | PRN

## 2014-10-28 MED ORDER — HYDROMORPHONE HCL 1 MG/ML IJ SOLN: 0.2500 mg | INTRAMUSCULAR | Status: DC | PRN

## 2014-10-28 MED ORDER — NEOSTIGMINE METHYLSULFATE 10 MG/10ML IV SOLN
INTRAVENOUS | Status: DC | PRN
Start: 2014-10-28 — End: 2014-10-28
  Administered 2014-10-28: 3 mg via INTRAMUSCULAR

## 2014-10-28 MED ORDER — GLYCOPYRROLATE 0.2 MG/ML IJ SOLN
INTRAMUSCULAR | Status: DC | PRN
  Administered 2014-10-28: 0.6 mg via INTRAVENOUS

## 2014-10-28 MED ORDER — NEOSTIGMINE METHYLSULFATE 10 MG/10ML IV SOLN: INTRAVENOUS | Status: DC | PRN

## 2014-10-28 MED ORDER — CEFAZOLIN SODIUM-DEXTROSE 2-3 GM-% IV SOLR
2.0000 g | INTRAVENOUS | Status: AC
  Administered 2014-10-28: 2 g via INTRAVENOUS
  Filled 2014-10-28: qty 50

## 2014-10-28 MED ORDER — LACTATED RINGERS IV SOLN
INTRAVENOUS | Status: DC | PRN
  Administered 2014-10-28: 21:00:00 via INTRAVENOUS

## 2014-10-28 SURGICAL SUPPLY — 37 items
BIT DRILL 4.3MMS DISTAL GRDTED (BIT) ×1 IMPLANT
BNDG COHESIVE 4X5 TAN STRL (GAUZE/BANDAGES/DRESSINGS) IMPLANT
BNDG COHESIVE 6X5 TAN STRL LF (GAUZE/BANDAGES/DRESSINGS) ×3 IMPLANT
CANISTER SUCT 1200ML W/VALVE (MISCELLANEOUS) ×3 IMPLANT
CHLORAPREP W/TINT 26ML (MISCELLANEOUS) ×3 IMPLANT
DRAPE C-ARMOR (DRAPES) ×3 IMPLANT
DRAPE SHEET LG 3/4 BI-LAMINATE (DRAPES) IMPLANT
DRAPE SURG 17X11 SM STRL (DRAPES) IMPLANT
DRAPE U-SHAPE 47X51 STRL (DRAPES) IMPLANT
DRILL 4.3MMS DISTAL GRADUATED (BIT) ×3
ELECT CAUTERY BLADE 6.4 (BLADE) ×3 IMPLANT
GAUZE SPONGE 4X4 12PLY STRL (GAUZE/BANDAGES/DRESSINGS) ×3 IMPLANT
GAUZE XEROFORM 4X4 STRL (GAUZE/BANDAGES/DRESSINGS) ×3 IMPLANT
GLOVE BIO SURGEON STRL SZ8 (GLOVE) ×6 IMPLANT
GLOVE INDICATOR 8.0 STRL GRN (GLOVE) ×9 IMPLANT
GOWN STRL REUS W/ TWL LRG LVL3 (GOWN DISPOSABLE) ×1 IMPLANT
GOWN STRL REUS W/ TWL XL LVL3 (GOWN DISPOSABLE) ×1 IMPLANT
GOWN STRL REUS W/TWL LRG LVL3 (GOWN DISPOSABLE) ×2
GOWN STRL REUS W/TWL XL LVL3 (GOWN DISPOSABLE) ×2
GUIDEPIN VERSANAIL DSP 3.2X444 ×3 IMPLANT
GUIDEWIRE BALL NOSE 100CM (WIRE) ×3 IMPLANT
HFN RH 130 DEG 11MM X 360MM (Orthopedic Implant) ×3 IMPLANT
HIP FRA NAIL LAG SCREW 10.5X90 (Orthopedic Implant) ×3 IMPLANT
MAT BLUE FLOOR 46X72 FLO (MISCELLANEOUS) ×3 IMPLANT
NEEDLE FILTER BLUNT 18X 1/2SAF (NEEDLE) ×2
NEEDLE FILTER BLUNT 18X1 1/2 (NEEDLE) ×1 IMPLANT
NS IRRIG 500ML POUR BTL (IV SOLUTION) ×3 IMPLANT
PACK HIP COMPR (MISCELLANEOUS) ×3 IMPLANT
PAD GROUND ADULT SPLIT (MISCELLANEOUS) ×3 IMPLANT
SCREW BONE CORTICAL 5.0X40 (Screw) ×3 IMPLANT
SCREW LAG HIP FRA NAIL 10.5X90 (Orthopedic Implant) ×1 IMPLANT
STAPLER SKIN PROX 35W (STAPLE) ×3 IMPLANT
STRAP SAFETY BODY (MISCELLANEOUS) ×3 IMPLANT
SUT VIC AB 1 CT1 36 (SUTURE) ×3 IMPLANT
SUT VIC AB 2-0 CT1 (SUTURE) ×3 IMPLANT
SYRINGE 10CC LL (SYRINGE) ×3 IMPLANT
TAPE MICROFOAM 4IN (TAPE) ×3 IMPLANT

## 2014-10-28 NOTE — Clinical Social Work Note (Signed)
Clinical Social Work Assessment  Patient Details  Name: Kristen Deleon MRN: [id number] Date of Birth: 01/24/1926  Date of referral:  10/28/14               Reason for consult:  Facility Placement, Other (Comment Required) (From [facility])                Permission sought to share information with:  Facility Medical sales representative Permission granted to share information::  Yes, Verbal Permission Granted  Name::      Skilled Nursing Facility/ [name].   Agency::   [place]   Relationship::     Contact Information:     Housing/Transportation Living arrangements for the past 2 months:  Assisted Dealer of Information:  Adult Children, Power of Attorney Patient Interpreter Needed:  None Criminal Activity/Legal Involvement Pertinent to Current Situation/Hospitalization:  No - Comment as needed Significant Relationships:  Adult Children Lives with:  Facility Resident Do you feel safe going back to the place where you live?  Yes Need for family participation in patient care:  Yes (Comment)  Care giving concerns: Patient is a resident at [facility].    Social Worker assessment / plan: Visual merchandiser (CSW) received SNF consult. Patient is having surgery today. CSW attempted to meet with patient however she was asleep. CSW contacted patient's son Kristen Deleon HPOA [phone] to complete assessment. Son reported that patient has been a resident at [facility] for over a year now. Son reported that he works in Office manager at [employer]. CSW explained to son that patient will have surgery today and will work with PT. PT will make a recommendation of home health or SNF. Son is agreeable to SNF search and prefers [facility]	[place].  FL2 complete and faxed out. CSW contacted Kim admissions coordinator at [facility] and asked her to review referral. CSW contacted Games developer at [employer]. Per Leanne Chang patient can return  to [name] if she meets criteria for family care home level of care. CSW will continue to follow and assist as needed.   Employment status:  Retired, Disabled (Comment on whether or not currently receiving Disability) Insurance information:  Managed Medicare PT Recommendations:  Not assessed at this time Information / Referral to community resources:  Skilled Nursing Facility  Patient/Family's Response to care: Son is agreeable to SNF search in [place].   Patient/Family's Understanding of and Emotional Response to Diagnosis, Current Treatment, and Prognosis: Patient's son was pleasant and thanked CSW for calling.   Emotional Assessment Appearance:  Appears stated age Attitude/Demeanor/Rapport:  Unable to Assess Affect (typically observed):  Unable to Assess Orientation:  Fluctuating Orientation (Suspected and/or reported Sundowners) Alcohol / Substance use:  Not Applicable Psych involvement (Current and /or in the community):  No (Comment)  Discharge Needs  Concerns to be addressed:  Discharge Planning Concerns Readmission within the last 30 days:  No Current discharge risk:  Cognitively Impaired Barriers to Discharge:  Continued Medical Work up   Haig Prophet, LCSW 10/28/2014, 2:30 PM

## 2014-10-28 NOTE — OR Nursing (Signed)
Patient to pacu with watch on, removed and placed in patient's chart.

## 2014-10-28 NOTE — Progress Notes (Signed)
Patient admit for right hip fracture. Hx dementia, confusion. Unable to complete admission profile at this time.

## 2014-10-28 NOTE — Progress Notes (Signed)
Kristen Deleon is a 81 y.o. female   SUBJECTIVE:  Patient with long-standing severe dementia admitted with right hip fracture. Vital signs stable  ______________________________________________________________________  ROS: Review of systems is unremarkable for any active cardiac,respiratory, GI, GU, hematologic, neurologic or psychiatric systems, 10 systems reviewed.  . cholecalciferol  2,000 Units Oral Daily  . divalproex  125 mg Oral Daily  . divalproex  250 mg Oral QHS  . docusate sodium  100 mg Oral BID  . LORazepam  0.5 mg Oral BID  . mirtazapine  7.5 mg Oral QHS  . pravastatin  40 mg Oral QHS  . risperiDONE  0.25 mg Oral BID  . vitamin C  500 mg Oral Daily   acetaminophen **OR** acetaminophen, morphine injection, ondansetron **OR** ondansetron (ZOFRAN) IV, oxyCODONE, polyethylene glycol   Past Medical History  Diagnosis Date  . Dementia   . Depressive psychosis   . Anxiety   . Insomnia   . Epilepsia     History reviewed. No pertinent past surgical history.  PHYSICAL EXAM:  BP 147/50 mmHg  Pulse 72  Temp(Src) 98.7 F (37.1 C) (Oral)  Resp 18  Ht  (1.626 m)  Wt 48.3 kg (106 lb 7.7 oz)  BMI 18.27 kg/m2  SpO2 97%  Wt Readings from Last 3 Encounters:  10/27/14 48.3 kg (106 lb 7.7 oz)           BP Readings from Last 3 Encounters:  10/28/14 147/50    Constitutional: NAD Neck: supple, no thyromegaly Respiratory: CTA, no rales or wheezes Cardiovascular: RRR, no murmur, no gallop Abdomen: soft, good BS, nontender Extremities: no edema Neuro: alert, not oriented, no focal motor or sensory deficits  ASSESSMENT/PLAN:  Labs and imaging studies were reviewed  Right hip fracture-low surgical risk, surgery today, prefer only Tylenol for pain, avoid narcotics Dementia-Alzheimer's type, moderate to severe, lives in assisted living Seizure disorder-on Depakote

## 2014-10-28 NOTE — Transfer of Care (Signed)
Immediate Anesthesia Transfer of Care Note  Patient: Kristen Deleon  Procedure(s) Performed: Procedure(s): INTRAMEDULLARY (IM) NAIL INTERTROCHANTRIC (Right)  Patient Location: PACU  Anesthesia Type:General  Level of Consciousness: sedated  Airway & Oxygen Therapy: Patient Spontanous Breathing and Patient connected to face mask oxygen  Post-op Assessment: Report given to RN  Post vital signs: Reviewed and stable  Last Vitals:  Filed Vitals:   10/28/14 2253  BP: 151/50  Pulse: 65  Temp: 37.3 C  Resp: 14    Complications: No apparent anesthesia complications

## 2014-10-28 NOTE — Anesthesia Procedure Notes (Signed)
Procedure Name: Intubation Date/Time: 10/28/2014 9:23 PM Performed by: Waldo Laine Pre-anesthesia Checklist: Patient identified, Emergency Drugs available, Suction available, Patient being monitored and Timeout performed Patient Re-evaluated:Patient Re-evaluated prior to inductionOxygen Delivery Method: Circle system utilized Preoxygenation: Pre-oxygenation with 100% oxygen Intubation Type: IV induction Laryngoscope Size: Miller and 2 Grade View: Grade II Tube type: Oral Number of attempts: 1 Airway Equipment and Method: Stylet Placement Confirmation: ETT inserted through vocal cords under direct vision,  positive ETCO2 and breath sounds checked- equal and bilateral Secured at: 20 cm Tube secured with: Tape Dental Injury: Teeth and Oropharynx as per pre-operative assessment

## 2014-10-28 NOTE — Care Management Note (Signed)
Case Management Note  Patient Details  Name: Kristen Deleon MRN: [id number] Date of Birth: Apr 27, 1925  Subjective/Objective:                 Patient presented from [facility] with hip fracture.  Plan for patient to go to OR at 4pm today.  Patient with history of dementia and unable to provide history   Action/Plan: RNCM to follow for discharge planning needs  Expected Discharge Date:                  Expected Discharge Plan:  Assisted Living / Rest Home  In-House Referral:     Discharge planning Services  CM Consult  Post Acute Care Choice:    Choice offered to:     DME Arranged:    DME Agency:     HH Arranged:    HH Agency:     Status of Service:  In process, will continue to follow  Medicare Important Message Given:    Date Medicare IM Given:    Medicare IM give by:    Date Additional Medicare IM Given:    Additional Medicare Important Message give by:     If discussed at Long Length of Stay Meetings, dates discussed:    Additional Comments:  Chapman Fitch, RN 10/28/2014, 10:08 AM

## 2014-10-28 NOTE — Clinical Social Work Placement (Signed)
   CLINICAL SOCIAL WORK PLACEMENT  NOTE  Date:  10/28/2014  Patient Details  Name: Kristen Deleon MRN: [id number] Date of Birth: 10/25/1925  Clinical Social Work is seeking post-discharge placement for this patient at the Skilled  Nursing Facility level of care (*CSW will initial, date and re-position this form in  chart as items are completed):  Yes   Patient/family provided with [facility] Clinical Social Work Department's list of facilities offering this level of care within the geographic area requested by the patient (or if unable, by the patient's family).  Yes   Patient/family informed of their freedom to choose among providers that offer the needed level of care, that participate in Medicare, Medicaid or managed care program needed by the patient, have an available bed and are willing to accept the patient.  Yes   Patient/family informed of [facility]'s ownership interest in [facility] and [facility], as well as of the fact that they are under no obligation to receive care at these facilities.  PASRR submitted to EDS on 10/28/14     PASRR number received on       Existing PASRR number confirmed on       FL2 transmitted to all facilities in geographic area requested by pt/family on 10/28/14     FL2 transmitted to all facilities within larger geographic area on       Patient informed that his/her managed care company has contracts with or will negotiate with certain facilities, including the following:            Patient/family informed of bed offers received.  Patient chooses bed at       Physician recommends and patient chooses bed at      Patient to be transferred to   on  .  Patient to be transferred to facility by       Patient family notified on   of transfer.  Name of family member notified:        PHYSICIAN Please sign FL2     Additional Comment:    _______________________________________________ Haig Prophet, LCSW 10/28/2014, 2:28 PM

## 2014-10-28 NOTE — Consult Note (Signed)
ORTHOPAEDIC CONSULTATION  REQUESTING PHYSICIAN: Danella Penton, MD  Chief Complaint:   Right hip pain  History of Present Illness: Kristen Deleon is a 81 y.o. female resident of an assisted living facility who has a history of dementia, depression, anxiety, and seizures. Apparently, she lost her balance and fell in her bathroom yesterday. The patient is unable to give a clear history as to what happened and what may have precipitated her fall. The fall was unwitnessed. She was brought to the emergency room complaining of right hip pain. X-rays in the emergency room demonstrated a displaced intertrochanteric fracture of her right hip. She is admitted at this time for definitive management of her injury after obtaining medical clearance. The patient apparently did not strike her head or lose consciousness as a result of the fall, and no other apparent injuries are noted.  Past Medical History  Diagnosis Date  . Dementia   . Depressive psychosis   . Anxiety   . Insomnia   . Epilepsia    History reviewed. No pertinent past surgical history. Social History   Social History  . Marital Status: Unknown    Spouse Name: N/A  . Number of Children: N/A  . Years of Education: N/A   Social History Main Topics  . Smoking status: Never Smoker   . Smokeless tobacco: None  . Alcohol Use: No  . Drug Use: No  . Sexual Activity: Not Asked   Other Topics Concern  . None   Social History Narrative  . None   Family History  Problem Relation Age of Onset  . Family history unknown: Yes   Allergies  Allergen Reactions  . Penicillins Other (See Comments)    Unknown     Prior to Admission medications   Medication Sig Start Date End Date Taking? Authorizing Provider  acetaminophen (TYLENOL) 325 MG tablet Take 650 mg by mouth 3 (three) times daily as needed (pain).   Yes Historical Provider, MD  aspirin EC 81 MG tablet Take 81 mg by  mouth daily.   Yes Historical Provider, MD  cholecalciferol (VITAMIN D) 1000 UNITS tablet Take 2,000 Units by mouth daily.   Yes Historical Provider, MD  divalproex (DEPAKOTE SPRINKLE) 125 MG capsule Take 250 mg by mouth at bedtime.   Yes Historical Provider, MD  divalproex (DEPAKOTE SPRINKLE) 125 MG capsule Take 125 mg by mouth daily.   Yes Historical Provider, MD  furosemide (LASIX) 40 MG tablet Take 40 mg by mouth every morning.   Yes Historical Provider, MD  loperamide (IMODIUM) 2 MG capsule Take 2 mg by mouth every 6 (six) hours as needed for diarrhea or loose stools.   Yes Historical Provider, MD  LORazepam (ATIVAN) 0.5 MG tablet Take 0.5 mg by mouth 2 (two) times daily.   Yes Historical Provider, MD  Melatonin 3 MG TABS Take 3 mg by mouth at bedtime.   Yes Historical Provider, MD  mirtazapine (REMERON) 15 MG tablet Take 7.5 mg by mouth at bedtime.   Yes Historical Provider, MD  polyethylene glycol (MIRALAX / GLYCOLAX) packet Take 17 g by mouth daily as needed (constipation).   Yes Historical Provider, MD  potassium chloride (K-DUR) 10 MEQ tablet Take 10 mEq by mouth daily.   Yes Historical Provider, MD  pravastatin (PRAVACHOL) 40 MG tablet Take 40 mg by mouth at bedtime.   Yes Historical Provider, MD  risperiDONE (RISPERDAL) 0.25 MG tablet Take 0.25 mg by mouth 2 (two) times daily.   Yes Historical Provider, MD  vitamin C (ASCORBIC ACID) 500 MG tablet Take 500 mg by mouth daily.   Yes Historical Provider, MD   Dg Chest 1 View  10/27/2014   CLINICAL DATA:  Status post 2 falls, with concern for chest injury. Initial encounter.  EXAM: CHEST  1 VIEW  COMPARISON:  Chest radiograph performed 02/11/2014  FINDINGS: The lungs are well-aerated. Vascular congestion is noted, with chronically increased interstitial markings. There is no evidence of pleural effusion or pneumothorax.  The cardiomediastinal silhouette is mildly enlarged. No acute osseous abnormalities are seen.  IMPRESSION: Vascular  congestion and mild cardiomegaly, with chronically increased interstitial markings. No displaced rib fracture seen.   Electronically Signed   By: Roanna Raider M.D.   On: 10/27/2014 21:13   Ct Head Wo Contrast  10/27/2014   CLINICAL DATA:  Patient fell at nursing home twice today dementia  EXAM: CT HEAD WITHOUT CONTRAST  CT CERVICAL SPINE WITHOUT CONTRAST  TECHNIQUE: Multidetector CT imaging of the head and cervical spine was performed following the standard protocol without intravenous contrast. Multiplanar CT image reconstructions of the cervical spine were also generated.  COMPARISON:  03/01/2014  FINDINGS: CT HEAD FINDINGS  Stable hyperostosis of the calvarium. Severe diffuse atrophy. No hemorrhage infarct mass hydrocephalus or extra-axial fluid. No skull fracture.  CT CERVICAL SPINE FINDINGS  No soft tissue abnormalities. Normal alignment. Mild degenerative disc disease throughout the cervical spine. No fracture. Lung apices clear.  IMPRESSION: No acute abnormalities.  Chronic intracranial atrophy.  No evidence of cervical spine fracture.   Electronically Signed   By: Esperanza Heir M.D.   On: 10/27/2014 21:26   Ct Cervical Spine Wo Contrast  10/27/2014   CLINICAL DATA:  Patient fell at nursing home twice today dementia  EXAM: CT HEAD WITHOUT CONTRAST  CT CERVICAL SPINE WITHOUT CONTRAST  TECHNIQUE: Multidetector CT imaging of the head and cervical spine was performed following the standard protocol without intravenous contrast. Multiplanar CT image reconstructions of the cervical spine were also generated.  COMPARISON:  03/01/2014  FINDINGS: CT HEAD FINDINGS  Stable hyperostosis of the calvarium. Severe diffuse atrophy. No hemorrhage infarct mass hydrocephalus or extra-axial fluid. No skull fracture.  CT CERVICAL SPINE FINDINGS  No soft tissue abnormalities. Normal alignment. Mild degenerative disc disease throughout the cervical spine. No fracture. Lung apices clear.  IMPRESSION: No acute  abnormalities.  Chronic intracranial atrophy.  No evidence of cervical spine fracture.   Electronically Signed   By: Esperanza Heir M.D.   On: 10/27/2014 21:26   Dg Hip Unilat With Pelvis 2-3 Views Right  10/27/2014   CLINICAL DATA:  Larey Seat twice today at her nursing home.  EXAM: DG HIP (WITH OR WITHOUT PELVIS) 2-3V RIGHT  COMPARISON:  None.  FINDINGS: There is an intertrochanteric right hip fracture with mild comminution and moderate varus angulation. There is no dislocation.  IMPRESSION: Intertrochanteric right hip fracture   Electronically Signed   By: Ellery Plunk M.D.   On: 10/27/2014 21:13    Positive ROS: All other systems have been reviewed and were otherwise negative with the exception of those mentioned in the HPI and as above.  Physical Exam: General:  Alert, no acute distress Psychiatric:  Patient is not competent for consent   Cardiovascular:  No pedal edema Respiratory:  No wheezing, non-labored breathing GI:  Abdomen is soft and non-tender Skin:  No lesions in the area of chief complaint Neurologic:  Sensation intact distally Lymphatic:  No axillary or cervical lymphadenopathy  Orthopedic Exam:  Orthopedic  examination is limited to the right hip and lower extremity. The patient's right lower extremity somewhat shortened and externally rotated as compared to the left. She has pain with any attempted active or passive motion of the hip. Skin inspection around the hip is unremarkable. She does have some tenderness to palpation over the trochanteric region laterally. She is able to actively dorsiflex and plantarflex her toes. Sensation is intact to light touch. She has excellent capillary refill to her right foot.  X-rays:  X-rays of the pelvis and right hip are available for review. The findings are as described above.  Assessment: Displaced closed intertrochanteric fracture right hip.  Plan: The treatment options were discussed with the patient, and will be discussed with  the daughter. Based on the fracture pattern, I feel she would be best managed with a reduction and internal fixation using a trochanteric femoral nail. Risks include bleeding, infection, nerve and/or blood vessel injury, persistent or recurrent pain, malunion and/or nonunion, development of arthritis, need for further surgery, blood clots, strokes, heart attacks and/or arrhythmias, etc. Benefits include early mobilization in order to reduce likelihood of blood clots, bedsores, pneumonia, etc. The nursing staff will obtain consent from the patient's daughter.  Thank you for ask me to participate in the care of this most unfortunate woman. I will be happy to follow her with you.    Maryagnes Amos, MD  Beeper #:  [phone]  10/28/2014 9:18 AM

## 2014-10-28 NOTE — Op Note (Signed)
10/27/2014 - 10/28/2014  10:42 PM  Patient:   Kristen Deleon  Pre-Op Diagnosis:   Closed displaced 3-part intertrochanteric fracture, right hip.  Post-Op Diagnosis:   Same.  Procedure:   Reduction and internal fixation of right hip fracture with Biomet Affyxis TFN nail.  Surgeon:   Maryagnes Amos, MD  Assistant:   None  Anesthesia:   General endotracheal intubation  Findings:   As above  Complications:   None  EBL:   50 cc  Fluids:   700 cc crystalloid  UOP:   200 cc  TT:   None  Drains:   None  Closure:   Staples  Implants:   Biomet Affyxis 11 x 360 mm TFN with a 90 mm lag screw and a 40 mm distal interlocking screw  Brief Clinical Note:   The patient is an 81 year old pleasantly demented female resident of an assisted living facility who apparently fell last evening and injured her right hip. She was brought to the emergency room where x-rays demonstrated a displaced intertrochanteric fracture of her right hip. The patient has been cleared medically and presents at this time for reduction and internal fixation of the right hip fracture.  Procedure:   The patient was brought into the operating room. After adequate general endotracheal intubation anesthesia was obtained, the patient was lain in the supine position on the fracture table. The uninjured leg was placed in a flexed and abducted position while the injured lower extremity was placed in longitudinal traction. The fracture was reduced using longitudinal traction and internal rotation. The adequacy of reduction was verified fluoroscopically in AP and lateral projections and found to be near anatomic. The lateral aspects of the right hip and thigh were prepped with ChloraPrep solution before being draped sterilely. Preoperative antibiotics were administered. The greater trochanter was identified fluoroscopically and an approximately 3 cm incision made about 2-3 fingerbreadths above the tip of the greater trochanter. The  incision was carried down through the subcutaneous tissues to expose the gluteal fascia. This was split the length of the incision, providing access to the tip of the trochanter. Under fluoroscopic guidance, a guidewire was drilled through the tip of the trochanter into the proximal metaphysis to the level of the lesser trochanter. After verifying its position fluoroscopically in AP and lateral projections, it was overreamed with the initial reamer to the depth of the lesser trochanter. A guidewire was passed down through the femoral canal to the supracondylar region. The adequacy of guidewire position was verified fluoroscopically in AP and lateral projections before the length of the guidewire within the canal was measured and found to be 370 mm. It was overreamed sequentially using the flexible reamers, beginning with a 10 mm reamer and progressing to a 12.5 mm reamer. This provided good cortical chatter. The 11 x 360 mm Biomet Affyxis TFN rod was selected and advanced to the appropriate depth, as verified fluoroscopically. The guide system for the lag screw was positioned and advanced through an approximately 2 cm stab incision over the lateral aspect of the proximal femur. The guidewire was drilled up through the trochanteric femoral nail and into the femoral neck to rest within 5 mm of subchondral bone. After verifying its position in the femoral neck and head in both AP and lateral projections, the guidewire was measured and found to be optimally replicated by a 90 mm lag screw. The guidewire was overreamed to the appropriate depth before the lag screw was inserted and advanced to the appropriate depth  as verified fluoroscopically in AP and lateral projections. The locking screw was advanced, then backed off a quarter turn to set the lag screw. Again the adequacy of hardware position and fracture reduction was verified fluoroscopically in AP and lateral projections and found to be excellent.  Attention was  directed distally. Using the "perfect circle" technique, the leg and fluoroscopy machine were positioned appropriately. An approximate 1.5 cm stab incision was made over the skin at the appropriate point before the drill bit was advanced through the cortex and across the static hole of the nail. The appropriate length of the screw was determined before the 40 mm distal interlocking screw was positioned, then advanced and tightened securely. Again the adequacy of screw position was verified fluoroscopically in AP and lateral projections and found to be excellent.  The wounds were irrigated thoroughly with sterile saline solution before the deeper subcutaneous tissues were closed using 2-0 Vicryl interrupted sutures. The skin was closed using staples. Sterile bulky dressings were applied to all wounds before the patient was transferred back to her hospital bed. The patient was then transferred to the recovery room in satisfactory condition after tolerating the procedure well.

## 2014-10-28 NOTE — Anesthesia Preprocedure Evaluation (Signed)
Anesthesia Evaluation  Patient identified by MRN, date of birth, ID band Patient awake    Reviewed: Allergy & Precautions, H&P , NPO status , Patient's Chart, lab work & pertinent test results, reviewed documented beta blocker date and time   Airway Mallampati: II  TM Distance: >3 FB Neck ROM: full    Dental no notable dental hx.    Pulmonary neg pulmonary ROS,  breath sounds clear to auscultation  Pulmonary exam normal       Cardiovascular Exercise Tolerance: Good negative cardio ROS  Rhythm:regular Rate:Normal     Neuro/Psych negative neurological ROS  negative psych ROS   GI/Hepatic negative GI ROS, Neg liver ROS,   Endo/Other  negative endocrine ROS  Renal/GU negative Renal ROS  negative genitourinary   Musculoskeletal   Abdominal   Peds  Hematology negative hematology ROS (+)   Anesthesia Other Findings   Reproductive/Obstetrics negative OB ROS                             Anesthesia Physical Anesthesia Plan  ASA: III and emergent  Anesthesia Plan: General, Regional and Spinal   Post-op Pain Management:    Induction:   Airway Management Planned:   Additional Equipment:   Intra-op Plan:   Post-operative Plan:   Informed Consent: I have reviewed the patients History and Physical, chart, labs and discussed the procedure including the risks, benefits and alternatives for the proposed anesthesia with the patient or authorized representative who has indicated his/her understanding and acceptance.   Dental Advisory Given  Plan Discussed with: CRNA  Anesthesia Plan Comments:         Anesthesia Quick Evaluation

## 2014-10-29 ENCOUNTER — Encounter: Payer: Self-pay | Admitting: Surgery

## 2014-10-29 LAB — CBC WITH DIFFERENTIAL/PLATELET
Basophils Absolute: 0 10*3/uL (ref 0–0.1)
EOS ABS: 0 10*3/uL (ref 0–0.7)
Eosinophils Relative: 0 %
HEMATOCRIT: 21.1 % — AB (ref 35.0–47.0)
Hemoglobin: 7.2 g/dL — ABNORMAL LOW (ref 12.0–16.0)
LYMPHS ABS: 0.3 10*3/uL — AB (ref 1.0–3.6)
Lymphocytes Relative: 3 %
MCH: 32.9 pg (ref 26.0–34.0)
MCHC: 34.1 g/dL (ref 32.0–36.0)
MCV: 96.4 fL (ref 80.0–100.0)
MONO ABS: 0.8 10*3/uL (ref 0.2–0.9)
NEUTROS ABS: 7 10*3/uL — AB (ref 1.4–6.5)
Neutrophils Relative %: 87 %
Platelets: 102 10*3/uL — ABNORMAL LOW (ref 150–440)
RBC: 2.19 MIL/uL — ABNORMAL LOW (ref 3.80–5.20)
RDW: 13.7 % (ref 11.5–14.5)
WBC: 8.1 10*3/uL (ref 3.6–11.0)

## 2014-10-29 LAB — BASIC METABOLIC PANEL
Anion gap: 3 — ABNORMAL LOW (ref 5–15)
BUN: 13 mg/dL (ref 6–20)
CALCIUM: 7.8 mg/dL — AB (ref 8.9–10.3)
CHLORIDE: 110 mmol/L (ref 101–111)
CO2: 27 mmol/L (ref 22–32)
CREATININE: 0.74 mg/dL (ref 0.44–1.00)
GFR calc non Af Amer: >60 mL/min (ref >60)
Glucose, Bld: 141 mg/dL — ABNORMAL HIGH (ref 65–99)
Potassium: 3.8 mmol/L (ref 3.5–5.1)
Sodium: 140 mmol/L (ref 135–145)

## 2014-10-29 LAB — PREPARE RBC (CROSSMATCH)

## 2014-10-29 LAB — C DIFFICILE QUICK SCREEN W PCR REFLEX
C DIFFICILE (CDIFF) TOXIN: NEGATIVE
C Diff antigen: NEGATIVE
C Diff interpretation: NEGATIVE

## 2014-10-29 MED ORDER — ONDANSETRON HCL 4 MG PO TABS: 4.0000 mg | ORAL_TABLET | Freq: Four times a day (QID) | ORAL | Status: DC | PRN

## 2014-10-29 MED ORDER — METOCLOPRAMIDE HCL 5 MG PO TABS: 5.0000 mg | ORAL_TABLET | Freq: Three times a day (TID) | ORAL | Status: DC | PRN

## 2014-10-29 MED ORDER — FUROSEMIDE 10 MG/ML IJ SOLN
10.0000 mg | Freq: Once | INTRAMUSCULAR | Status: AC
  Administered 2014-10-29: 10 mg via INTRAVENOUS
  Filled 2014-10-29: qty 2

## 2014-10-29 MED ORDER — PANTOPRAZOLE SODIUM 40 MG PO TBEC
40.0000 mg | DELAYED_RELEASE_TABLET | Freq: Two times a day (BID) | ORAL | Status: DC
  Administered 2014-10-29 – 2014-10-31 (×5): 40 mg via ORAL
  Filled 2014-10-29 (×5): qty 1

## 2014-10-29 MED ORDER — ENSURE ENLIVE PO LIQD
237.0000 mL | Freq: Three times a day (TID) | ORAL | Status: DC
  Administered 2014-10-29 – 2014-10-31 (×4): 237 mL via ORAL

## 2014-10-29 MED ORDER — CEFAZOLIN SODIUM-DEXTROSE 2-3 GM-% IV SOLR
2.0000 g | Freq: Four times a day (QID) | INTRAVENOUS | Status: AC
  Administered 2014-10-29 (×2): 2 g via INTRAVENOUS
  Filled 2014-10-29 (×2): qty 50

## 2014-10-29 MED ORDER — BISACODYL 10 MG RE SUPP: 10.0000 mg | Freq: Every day | RECTAL | Status: DC | PRN

## 2014-10-29 MED ORDER — DIPHENHYDRAMINE HCL 12.5 MG/5ML PO ELIX: 12.5000 mg | ORAL_SOLUTION | ORAL | Status: DC | PRN

## 2014-10-29 MED ORDER — ONDANSETRON HCL 4 MG/2ML IJ SOLN: 4.0000 mg | Freq: Four times a day (QID) | INTRAMUSCULAR | Status: DC | PRN

## 2014-10-29 MED ORDER — FLEET ENEMA 7-19 GM/118ML RE ENEM: 1.0000 | ENEMA | Freq: Once | RECTAL | Status: DC | PRN

## 2014-10-29 MED ORDER — HYDROMORPHONE HCL 1 MG/ML IJ SOLN
0.5000 mg | INTRAMUSCULAR | Status: DC | PRN
Start: 2014-10-29 — End: 2014-10-29
  Administered 2014-10-29 (×2): 1 mg via INTRAVENOUS
  Filled 2014-10-29 (×2): qty 1

## 2014-10-29 MED ORDER — DOCUSATE SODIUM 100 MG PO CAPS: 100.0000 mg | ORAL_CAPSULE | Freq: Two times a day (BID) | ORAL | Status: DC

## 2014-10-29 MED ORDER — ACETAMINOPHEN 650 MG RE SUPP: 650.0000 mg | Freq: Four times a day (QID) | RECTAL | Status: DC | PRN

## 2014-10-29 MED ORDER — METOCLOPRAMIDE HCL 5 MG/ML IJ SOLN: 5.0000 mg | Freq: Three times a day (TID) | INTRAMUSCULAR | Status: DC | PRN

## 2014-10-29 MED ORDER — KCL IN DEXTROSE-NACL 20-5-0.9 MEQ/L-%-% IV SOLN
INTRAVENOUS | Status: DC
  Administered 2014-10-29: 02:00:00 via INTRAVENOUS
  Filled 2014-10-29 (×4): qty 1000

## 2014-10-29 MED ORDER — ENOXAPARIN SODIUM 40 MG/0.4ML ~~LOC~~ SOLN
40.0000 mg | SUBCUTANEOUS | Status: DC
  Administered 2014-10-29 – 2014-10-31 (×2): 40 mg via SUBCUTANEOUS
  Filled 2014-10-29 (×2): qty 0.4

## 2014-10-29 MED ORDER — HYDROMORPHONE HCL 1 MG/ML IJ SOLN
0.2500 mg | INTRAMUSCULAR | Status: DC | PRN
  Administered 2014-10-29 – 2014-10-31 (×5): 0.25 mg via INTRAVENOUS
  Filled 2014-10-29 (×6): qty 1

## 2014-10-29 MED ORDER — SODIUM CHLORIDE 0.9 % IV SOLN
Freq: Once | INTRAVENOUS | Status: AC
  Administered 2014-10-29: 12:00:00 via INTRAVENOUS

## 2014-10-29 MED ORDER — OXYCODONE-ACETAMINOPHEN 5-325 MG PO TABS
1.0000 | ORAL_TABLET | Freq: Three times a day (TID) | ORAL | Status: DC | PRN
  Administered 2014-10-29 – 2014-10-31 (×4): 1 via ORAL
  Filled 2014-10-29 (×4): qty 1

## 2014-10-29 MED ORDER — ACETAMINOPHEN 325 MG PO TABS: 650.0000 mg | ORAL_TABLET | Freq: Four times a day (QID) | ORAL | Status: DC | PRN

## 2014-10-29 MED ORDER — OXYCODONE HCL 5 MG PO TABS: 5.0000 mg | ORAL_TABLET | ORAL | Status: DC | PRN

## 2014-10-29 MED ORDER — MAGNESIUM HYDROXIDE 400 MG/5ML PO SUSP: 30.0000 mL | Freq: Every day | ORAL | Status: DC | PRN

## 2014-10-29 NOTE — Progress Notes (Signed)
  Subjective: 1 Day Post-Op Procedure(s) (LRB): INTRAMEDULLARY (IM) NAIL INTERTROCHANTRIC (Right) Patient reports pain as moderate.  Pt unable to answer specifically Patient is well, and has had no acute complaints or problems Plan is to go Nursing Home after hospital stay. Negative for chest pain and shortness of breath Fever: no Gastrointestinal:negative for nausea and vomiting  Objective: Vital signs in last 24 hours: Temp:  [97.7 F (36.5 C)-99.1 F (37.3 C)] 98.7 F (37.1 C) (08/31 0454) Pulse Rate:  [51-100] 100 (08/31 0454) Resp:  [13-18] 17 (08/31 0454) BP: (151-175)/(46-66) 170/55 mmHg (08/31 0454) SpO2:  [91 %-100 %] 92 % (08/31 0454) FiO2 (%):  [21 %] 21 % (08/31 0026)  Intake/Output from previous day:  Intake/Output Summary (Last 24 hours) at 10/29/14 0936 Last data filed at 10/29/14 0740  Gross per 24 hour  Intake 1167.5 ml  Output    350 ml  Net  817.5 ml    Intake/Output this shift:    Labs:  Recent Labs  10/27/14 2032 10/28/14 0634 10/29/14 0710  HGB 10.7* 9.4* 7.2*    Recent Labs  10/28/14 0634 10/29/14 0710  WBC 9.2 8.1  RBC 2.94* 2.19*  HCT 28.0* 21.1*  PLT 141* 102*    Recent Labs  10/28/14 0634 10/29/14 0710  NA 137 140  K 3.8 3.8  CL 104 110  CO2 27 27  BUN 15 13  CREATININE 0.76 0.74  GLUCOSE 125* 141*  CALCIUM 8.6* 7.8*    Recent Labs  10/27/14 2032  INR 1.00     EXAM General - Patient is Disorganized and Confused Extremity - ABD soft Sensation intact distally Intact pulses distally Dorsiflexion/Plantar flexion intact Incision: dressing C/D/I Dressing/Incision - clean, dry, no drainage Motor Function - intact, moving foot and toes well on exam.   Abdomen soft on exam. Bulky dressing intact  Past Medical History  Diagnosis Date  . Dementia   . Depressive psychosis   . Anxiety   . Insomnia   . Epilepsia     Assessment/Plan: 1 Day Post-Op Procedure(s) (LRB): INTRAMEDULLARY (IM) NAIL  INTERTROCHANTRIC (Right) Principal Problem:   Intertrochanteric fracture of right femur  Estimated body mass index is 18.27 kg/(m^2) as calculated from the following:   Height as of this encounter:  (1.626 m).   Weight as of this encounter: 48.3 kg (106 lb 7.7 oz). Advance diet Up with therapy  DVT Prophylaxis - Lovenox, Foot Pumps and TED hose Weight-Bearing as tolerated to right leg  Pt is confused and unable to answer specific questions.  Spoke with nursing staff and this is her baseline, history of Alzheimer's. Hg down to 7.2 from 10.7 on 8/29.  Will continue to monitor for potential transfusion. Foley to be removed today following first PT session.  Valeria Batman, PA-C [facility] Orthopaedic Surgery 10/29/2014, 9:36 AM

## 2014-10-29 NOTE — Progress Notes (Signed)
Pt. Received from PACU. VSS. Neurochecks WDL. Foley patent. IVF infusing.

## 2014-10-29 NOTE — Progress Notes (Signed)
Patient ID: Kristen Deleon, female   DOB: 1925/04/03, 81 y.o.   MRN: [id number] Kristen Deleon is a 81 y.o. female   SUBJECTIVE:  Patient with long-standing severe dementia admitted with right hip fracture. Patient somnolent this morning postop  ______________________________________________________________________  ROS: Review of systems is unremarkable for any active cardiac,respiratory, GI, GU, hematologic, neurologic or psychiatric systems, 10 systems reviewed.  . cholecalciferol  2,000 Units Oral Daily  . divalproex  125 mg Oral Daily  . divalproex  250 mg Oral QHS  . docusate sodium  100 mg Oral BID  . [START ON 10/30/2014] enoxaparin (LOVENOX) injection  40 mg Subcutaneous Q24H  . LORazepam  0.5 mg Oral BID  . mirtazapine  7.5 mg Oral QHS  . pantoprazole  40 mg Oral BID AC  . pravastatin  40 mg Oral QHS  . risperiDONE  0.25 mg Oral BID  . vitamin C  500 mg Oral Daily   acetaminophen **OR** acetaminophen, bisacodyl, HYDROmorphone (DILAUDID) injection, magnesium hydroxide, metoCLOPramide **OR** metoCLOPramide (REGLAN) injection, ondansetron **OR** ondansetron (ZOFRAN) IV, ondansetron (ZOFRAN) IV, oxyCODONE-acetaminophen, polyethylene glycol, sodium phosphate   Past Medical History  Diagnosis Date  . Dementia   . Depressive psychosis   . Anxiety   . Insomnia   . Epilepsia     History reviewed. No pertinent past surgical history.  PHYSICAL EXAM:  BP 170/55 mmHg  Pulse 100  Temp(Src) 98.7 F (37.1 C) (Oral)  Resp 17  Ht  (1.626 m)  Wt 48.3 kg (106 lb 7.7 oz)  BMI 18.27 kg/m2  SpO2 92%  Wt Readings from Last 3 Encounters:  10/27/14 48.3 kg (106 lb 7.7 oz)           BP Readings from Last 3 Encounters:  10/29/14 170/55    Constitutional: NAD Neck: supple, no thyromegaly Respiratory: CTA, no rales or wheezes Cardiovascular: RRR, no murmur, no gallop Abdomen: soft, good BS, nontender Extremities: no edema Neuro: Somnolent  ASSESSMENT/PLAN:  Labs and  imaging studies were reviewed  Right hip fracture-postop, stable, prefer very low-dose narcotics versus Tylenol for pain  Dementia-Alzheimer's type, moderate to severe, lives in assisted living Seizure disorder-on Depakote Skilled nursing

## 2014-10-29 NOTE — Anesthesia Postprocedure Evaluation (Signed)
  Anesthesia Post-op Note  Patient: Kristen Deleon  Procedure(s) Performed: Procedure(s): INTRAMEDULLARY (IM) NAIL INTERTROCHANTRIC (Right)  Anesthesia type:General, Regional, Spinal  Patient location: 152  Post pain: Pain level controlled  Post assessment: Post-op Vital signs reviewed, Patient's Cardiovascular Status Stable, Respiratory Function Stable, Patent Airway and No signs of Nausea or vomiting  Post vital signs: Reviewed and stable  Last Vitals:  Filed Vitals:   10/29/14 0454  BP: 170/55  Pulse: 100  Temp: 37.1 C  Resp: 17    Level of consciousness: awake, alert  and patient cooperative  Complications: No apparent anesthesia complications

## 2014-10-29 NOTE — Progress Notes (Signed)
Initial Nutrition Assessment   INTERVENTION:   Meals and Snacks: Cater to patient preferences; confirmed with Nsg pt is a feeder, getting assistance at meals. Will send meats chopped for ease of eating/chewing. Coordination of Care: if pt at risk for aspiration, recommend SLP evaulation. Medical Food Supplement Therapy: will recommend Ensure Enlive po TID, each supplement provides 350 kcal and 20 grams of protein    NUTRITION DIAGNOSIS:   Inadequate oral intake related to acute illness as evidenced by  (NPO/CL since admission until today).  GOAL:   Patient will meet greater than or equal to 90% of their needs  MONITOR:    (Energy Intake, Anthropometrics, Digestive system)  REASON FOR ASSESSMENT:   Malnutrition Screening Tool    ASSESSMENT:   Pt admitted s/p fall with right intertrochanteric fracture of femur, surigical intervention 10/28/2014. Pt with h/o severe dementia.  Past Medical History  Diagnosis Date  . Dementia   . Depressive psychosis   . Anxiety   . Insomnia   . Epilepsia     Diet Order:  Diet regular Room service appropriate?: Yes; Fluid consistency:: Thin    Current Nutrition: Pt just advanced to regular, tolerating CL this am.  Food/Nutrition-Related History: Unable to clarify with pt secondary to confusion.   Medications: D5 NS with KCl at 64mL/hr, Remeron, vitamin C and D, colace  Electrolyte/Renal Profile and Glucose Profile:   Recent Labs Lab 10/27/14 2032 10/28/14 0634 10/29/14 0710  NA 137 137 140  K 3.6 3.8 3.8  CL 102 104 110  CO2 BUN CREATININE 0.91 0.76 0.74  CALCIUM 8.9 8.6* 7.8*  GLUCOSE 141* 125* 141*   Protein Profile: No results for input(s): ALBUMIN in the last 168 hours.  Gastrointestinal Profile: Last BM: unknown   Nutrition-Focused Physical Exam Findings:  Unable to complete Nutrition-Focused physical exam at this time.    Weight Change: Unable to determine.   Skin:  Reviewed, no  issues   Height:   Ht Readings from Last 1 Encounters:  10/27/14  (1.626 m)    Weight:   Wt Readings from Last 1 Encounters:  10/27/14 106 lb 7.7 oz (48.3 kg)    BMI:  Body mass index is 18.27 kg/(m^2).  Estimated Nutritional Needs:   Kcal:  1300-1517kcals, BEE: 903kcals, TEE: (1.2-1.4)(AF 1.2)  Protein:  48-57g protein (1.0-1.2g/kg)   Fluid:  1208-1440mL of fluid (25-60mL/kg)  EDUCATION NEEDS:   No education needs identified at this time   HIGH Care Level  Leda Quail, RD, LDN Pager [phone]

## 2014-10-29 NOTE — Clinical Social Work Placement (Signed)
   CLINICAL SOCIAL WORK PLACEMENT  NOTE  Date:  10/29/2014  Patient Details  Name: Kristen Deleon MRN: [id number] Date of Birth: Jul 13, 1925  Clinical Social Work is seeking post-discharge placement for this patient at the Skilled  Nursing Facility level of care (*CSW will initial, date and re-position this form in  chart as items are completed):  Yes   Patient/family provided with [facility] Clinical Social Work Department's list of facilities offering this level of care within the geographic area requested by the patient (or if unable, by the patient's family).  Yes   Patient/family informed of their freedom to choose among providers that offer the needed level of care, that participate in Medicare, Medicaid or managed care program needed by the patient, have an available bed and are willing to accept the patient.  Yes   Patient/family informed of 's ownership interest in [facility] and [facility], as well as of the fact that they are under no obligation to receive care at these facilities.  PASRR submitted to EDS on 10/28/14     PASRR number received on 10/29/14 (PASARR Received)     Existing PASRR number confirmed on       FL2 transmitted to all facilities in geographic area requested by pt/family on 10/28/14     FL2 transmitted to all facilities within larger geographic area on       Patient informed that his/her managed care company has contracts with or will negotiate with certain facilities, including the following:            Patient/family informed of bed offers received.  Patient chooses bed at       Physician recommends and patient chooses bed at      Patient to be transferred to   on  .  Patient to be transferred to facility by       Patient family notified on   of transfer.  Name of family member notified:        PHYSICIAN Please sign FL2     Additional Comment:    _______________________________________________ Haig Prophet,  LCSW 10/29/2014, 8:46 AM

## 2014-10-29 NOTE — Progress Notes (Signed)
Physical Therapy Evaluation Patient Details Name: Kristen Deleon MRN: [id number] DOB: Jun 20, 1925 Today's Date: 10/29/2014   History of Present Illness  Pt is a 81 yo female who was admitted to [facility] s/p R hip ORIF on 10/28/14 secondary to fall in bathroom  Clinical Impression  Pt presents with hx of dementia, depressive psychosis, anxiety, insomnia, and epilepsia. Examination reveals that pt is currently bed bound secondary to pain and severe dementia. She is noted to have profound generalized weakness as well. Although mobility was limited today, pt was pleasantly confused and able to participate with therapy to the extent of limited therapeutic exercise. She requires a great deal of simple cueing to keep on task, but she is able to participate with encouragement. Pt will benefit from skilled PT in order to address her mobility, strength, and activity tolerance deficits in order for her to return to optimal PLOF.     Follow Up Recommendations SNF;Supervision/Assistance - 24 hour    Equipment Recommendations   (TBD)    Recommendations for Other Services       Precautions / Restrictions Precautions Precautions: Fall Restrictions Weight Bearing Restrictions: Yes Other Position/Activity Restrictions: WBAT      Mobility  Bed Mobility               General bed mobility comments: Not able to assess due to impaired cognition and pain  Transfers                 General transfer comment: Not able to assess due to impaired cognition and pain  Ambulation/Gait             General Gait Details: Not able to assess due to impaired cognition and pain  Stairs            Wheelchair Mobility    Modified Rankin (Stroke Patients Only)       Balance Overall balance assessment: History of Falls                                           Pertinent Vitals/Pain Pain Assessment:  (States pain; cannot communicate )    Home Living Family/patient  expects to be discharged to:: Assisted living                 Additional Comments: unable to obtain reliable hx secondary to cognition    Prior Function           Comments: unable to obtain hx secondary to cognition     Hand Dominance        Extremity/Trunk Assessment   Upper Extremity Assessment: Generalized weakness           Lower Extremity Assessment: Generalized weakness;Difficult to assess due to impaired cognition         Communication   Communication: Expressive difficulties;Receptive difficulties (Severe dementia )  Cognition Arousal/Alertness: Lethargic Behavior During Therapy: Flat affect (occasionally emotional) Overall Cognitive Status: History of cognitive impairments - at baseline                      General Comments General comments (skin integrity, edema, etc.): Pt difficult to understand only speaking one to two words with occasional moments of clarity in stating that "she doesn't think she can do it." However effort was noted and pt is able to follow single step commands. Pt became emotional on a couple  occasions and needs encouragement throughout session.    Exercises Other Exercises Other Exercises: Pt performed bilateral ankle pumps x 5 reps with min assist. She also was able to perform 5 heel slides on the LLE and 3 on the RLE, but it was observed to be very painful on pt's face. Pt was able to perform bilateral arm raising AROM x 10 reps at supervision       Assessment/Plan    PT Assessment Patient needs continued PT services  PT Diagnosis Difficulty walking;Abnormality of gait;Generalized weakness;Acute pain   PT Problem List Decreased strength;Decreased range of motion;Decreased activity tolerance;Decreased balance;Decreased mobility;Decreased coordination;Decreased cognition;Decreased knowledge of use of DME;Decreased safety awareness;Decreased knowledge of precautions;Pain  PT Treatment Interventions DME instruction;Gait  training;Stair training;Functional mobility training;Therapeutic activities;Therapeutic exercise;Balance training;Neuromuscular re-education;Cognitive remediation;Patient/family education;Wheelchair mobility training   PT Goals (Current goals can be found in the Care Plan section) Acute Rehab PT Goals Patient Stated Goal: Not able to state PT Goal Formulation: Patient unable to participate in goal setting Time For Goal Achievement: 11/12/14 Potential to Achieve Goals: Fair    Frequency 7X/week   Barriers to discharge        Co-evaluation               End of Session   Activity Tolerance: Patient limited by lethargy (Limited by cognition and pain) Patient left: in bed;with bed alarm set;with call bell/phone within reach;with SCD's reapplied Nurse Communication: Mobility status         Time: 9604-5409 PT Time Calculation (min) (ACUTE ONLY): 15 min   Charges:         PT G CodesBenna Dunks 11/01/2014, 12:07 PM  Benna Dunks, SPT. [phone]

## 2014-10-29 NOTE — Progress Notes (Signed)
Dr Joice Lofts notified of patient's 3 loose stools in the past couple of hours. Send c diff specimen. Hold back colace if needed.

## 2014-10-29 NOTE — Progress Notes (Signed)
Lance PA notified of no post op IV antibiotics. No new orders.

## 2014-10-29 NOTE — Care Management Important Message (Signed)
Important Message  Patient Details  Name: Kristen Deleon MRN: [id number] Date of Birth: [birth year]   Medicare Important Message Given:  Yes-second notification given    [name] 10/29/2014, 11:44 AM

## 2014-10-29 NOTE — Progress Notes (Signed)
Clinical Child psychotherapist (CSW) contacted patient's son Claressa Hughley and presented bed offers. Son chose [facility]	[place]. Plan is for patient to D/C to [facility] Friday 10/31/14. CSW will continue to follow and assist as needed.   Jetta Lout, LCSWA [phone]

## 2014-10-29 NOTE — Progress Notes (Signed)
Pt. Pleasantly confused. VSS. Foley patent. Neurochecks WDL. Pain controlled with meds per MAR. Pills crushed in applesauce. Dressing clean dry and intact. IVF's infusing. Resting quietly.

## 2014-10-30 ENCOUNTER — Encounter
Admission: RE | Admit: 2014-10-30 | Discharge: 2014-10-30 | Disposition: A | Discharge status: Home / Self Care | Payer: Medicare Other | Source: Ambulatory Visit | Attending: Internal Medicine | Admitting: Internal Medicine

## 2014-10-30 DIAGNOSIS — R7989 Other specified abnormal findings of blood chemistry: Secondary | ICD-10-CM | POA: Diagnosis not present

## 2014-10-30 LAB — TYPE AND SCREEN
ABO/RH(D): B POS
Antibody Screen: NEGATIVE
UNIT DIVISION: 0
Unit division: 0

## 2014-10-30 LAB — BASIC METABOLIC PANEL
Anion gap: 9 (ref 5–15)
BUN: 17 mg/dL (ref 6–20)
CALCIUM: 8.5 mg/dL — AB (ref 8.9–10.3)
CO2: 25 mmol/L (ref 22–32)
CREATININE: 0.72 mg/dL (ref 0.44–1.00)
Chloride: 107 mmol/L (ref 101–111)
GFR calc non Af Amer: >60 mL/min (ref >60)
Glucose, Bld: 121 mg/dL — ABNORMAL HIGH (ref 65–99)
Potassium: 3.2 mmol/L — ABNORMAL LOW (ref 3.5–5.1)
SODIUM: 141 mmol/L (ref 135–145)

## 2014-10-30 LAB — CBC WITH DIFFERENTIAL/PLATELET
Basophils Absolute: 0 10*3/uL (ref 0–0.1)
EOS ABS: 0 10*3/uL (ref 0–0.7)
HCT: 29.2 % — ABNORMAL LOW (ref 35.0–47.0)
HEMOGLOBIN: 10 g/dL — AB (ref 12.0–16.0)
Lymphocytes Relative: 5 %
Lymphs Abs: 0.5 10*3/uL — ABNORMAL LOW (ref 1.0–3.6)
MCH: 31.7 pg (ref 26.0–34.0)
MCHC: 34.2 g/dL (ref 32.0–36.0)
MCV: 92.6 fL (ref 80.0–100.0)
Monocytes Absolute: 1 10*3/uL — ABNORMAL HIGH (ref 0.2–0.9)
Monocytes Relative: 9 %
Neutro Abs: 8.6 10*3/uL — ABNORMAL HIGH (ref 1.4–6.5)
PLATELETS: 106 10*3/uL — AB (ref 150–440)
RBC: 3.15 MIL/uL — AB (ref 3.80–5.20)
RDW: 14.4 % (ref 11.5–14.5)
WBC: 10.1 10*3/uL (ref 3.6–11.0)

## 2014-10-30 LAB — URINALYSIS COMPLETE WITH MICROSCOPIC (ARMC ONLY)
BILIRUBIN URINE: NEGATIVE
Bacteria, UA: NONE SEEN
Glucose, UA: NEGATIVE mg/dL
Hgb urine dipstick: NEGATIVE
Nitrite: NEGATIVE
PROTEIN: 30 mg/dL — AB
SPECIFIC GRAVITY, URINE: 1.011 (ref 1.005–1.030)
pH: 6 (ref 5.0–8.0)

## 2014-10-30 LAB — TROPONIN I: TROPONIN I: 0.22 ng/mL — AB (ref <0.031)

## 2014-10-30 MED ORDER — ENSURE ENLIVE PO LIQD: 237.0000 mL | Freq: Three times a day (TID) | ORAL | Status: AC

## 2014-10-30 MED ORDER — METOPROLOL TARTRATE 25 MG PO TABS
25.0000 mg | ORAL_TABLET | Freq: Three times a day (TID) | ORAL | Status: DC
  Administered 2014-10-30 – 2014-10-31 (×4): 25 mg via ORAL
  Filled 2014-10-30 (×4): qty 1

## 2014-10-30 MED ORDER — RANITIDINE HCL 150 MG PO TABS: 150.0000 mg | ORAL_TABLET | Freq: Two times a day (BID) | ORAL | Status: AC

## 2014-10-30 MED ORDER — CEFTRIAXONE SODIUM 1 G IJ SOLR
1.0000 g | INTRAMUSCULAR | Status: DC
Start: 2014-10-30 — End: 2014-10-31
  Filled 2014-10-30 (×3): qty 10

## 2014-10-30 MED ORDER — METOPROLOL TARTRATE 25 MG PO TABS: 25.0000 mg | ORAL_TABLET | Freq: Two times a day (BID) | ORAL | Status: AC

## 2014-10-30 MED ORDER — BISACODYL 10 MG RE SUPP: 10.0000 mg | Freq: Every day | RECTAL | Status: AC | PRN

## 2014-10-30 MED ORDER — ACETAMINOPHEN 325 MG PO TABS: 650.0000 mg | ORAL_TABLET | Freq: Four times a day (QID) | ORAL | Status: AC | PRN

## 2014-10-30 MED ORDER — MAGNESIUM HYDROXIDE 400 MG/5ML PO SUSP: 30.0000 mL | Freq: Every day | ORAL | Status: AC | PRN

## 2014-10-30 MED ORDER — FUROSEMIDE 40 MG PO TABS: 20.0000 mg | ORAL_TABLET | ORAL | Status: AC

## 2014-10-30 MED ORDER — POTASSIUM CHLORIDE CRYS ER 10 MEQ PO TBCR
10.0000 meq | EXTENDED_RELEASE_TABLET | Freq: Two times a day (BID) | ORAL | Status: DC
  Administered 2014-10-30 – 2014-10-31 (×3): 10 meq via ORAL
  Filled 2014-10-30 (×3): qty 1

## 2014-10-30 MED ORDER — ENOXAPARIN SODIUM 40 MG/0.4ML ~~LOC~~ SOLN: 40.0000 mg | SUBCUTANEOUS | Status: DC

## 2014-10-30 NOTE — Progress Notes (Signed)
  Subjective: 2 Days Post-Op Procedure(s) (LRB): INTRAMEDULLARY (IM) NAIL INTERTROCHANTRIC (Right) Patient reports pain as mild.   Patient is well, and has had no acute complaints or problems Plan is to go Skilled nursing facility after hospital stay. Negative for chest pain and shortness of breath.  Pt said both yes and no when asked if she was experiencing chest pain. Fever: no Gastrointestinal:Negative for nausea and vomiting  Objective: Vital signs in last 24 hours: Temp:  [97.9 F (36.6 C)-98.9 F (37.2 C)] 97.9 F (36.6 C) (09/01 0730) Pulse Rate:  [80-101] 84 (09/01 0730) Resp:  [14-18] 14 (09/01 0730) BP: (104-186)/(44-64) 181/50 mmHg (09/01 0730) SpO2:  [94 %-99 %] 98 % (09/01 0730)  Intake/Output from previous day:  Intake/Output Summary (Last 24 hours) at 10/30/14 0925 Last data filed at 10/30/14 0912  Gross per 24 hour  Intake 1763.91 ml  Output    320 ml  Net 1443.91 ml    Intake/Output this shift: Total I/O In: -  Out: 50 [Urine:50]  Labs:  Recent Labs  10/27/14 2032 10/28/14 0634 10/29/14 0710 10/30/14 0549  HGB 10.7* 9.4* 7.2* 10.0*    Recent Labs  10/29/14 0710 10/30/14 0549  WBC 8.1 10.1  RBC 2.19* 3.15*  HCT 21.1* 29.2*  PLT 102* 106*    Recent Labs  10/29/14 0710 10/30/14 0549  NA 140 141  K 3.8 3.2*  CL 110 107  CO2 27 25  BUN 13 17  CREATININE 0.74 0.72  GLUCOSE 141* 121*  CALCIUM 7.8* 8.5*    Recent Labs  10/27/14 2032  INR 1.00     EXAM General - Patient is Alert, Confused and Lacking.  Cognitive abilities much improved compared to yesterday. Extremity - Neurologically intact ABD soft Intact pulses distally Dorsiflexion/Plantar flexion intact Incision: dressing C/D/I Dressing/Incision - clean, dry, no drainage Motor Function - intact, moving foot and toes well on exam.   Abdomen is soft with normal BS.   Past Medical History  Diagnosis Date  . Dementia   . Depressive psychosis   . Anxiety   .  Insomnia   . Epilepsia     Assessment/Plan: 2 Days Post-Op Procedure(s) (LRB): INTRAMEDULLARY (IM) NAIL INTERTROCHANTRIC (Right) Principal Problem:   Intertrochanteric fracture of right femur  Estimated body mass index is 18.27 kg/(m^2) as calculated from the following:   Height as of this encounter:  (1.626 m).   Weight as of this encounter: 48.3 kg (106 lb 7.7 oz). Advance diet Up with therapy   Bulky dressing changed today.  Honeycomb dressing applied to upper incisions and 4x4 applied to incision at knee. Hg up to 10.0 this morning following transfusion of 2 units on 10/29/14. K+ 3.2 this AM, Internal med ordered Kloc-con 10 mEq BID for today. Pt has not had a BM yet, Miralax and FLEET enema ordered if needed.  Pt is urinating well. Pt apparently agitated last night, much better this morning.  Cognitive abilities appear to be improved today compared to yesterday. Elevated Troponin- Internal med following.  EKG ordered as well as metoprolol  DVT Prophylaxis - Lovenox, Foot Pumps and TED hose Weight-Bearing as tolerated to right leg  J. Horris Latino, PA-C [facility] 10/30/2014, 9:25 AM

## 2014-10-30 NOTE — Progress Notes (Signed)
Dr. Hyacinth Meeker paged about troponin again.

## 2014-10-30 NOTE — Progress Notes (Signed)
Pt. Awake crying in pain. Repositioned and given dilaudid 0.25mg  IV.

## 2014-10-30 NOTE — Clinical Documentation Improvement (Signed)
Orthopedic  Can the diagnosis of anemia be further specified?   Acute Blood Loss Anemia  Iron deficiency Anemia  Nutritional anemia, including the nutrition or mineral deficits  Chronic Anemia, including the suspected or known cause  Anemia of chronic disease, including the associated chronic disease state  Other  Clinically Undetermined  Document any associated diagnoses/conditions. OR- 10/28/14; EBL= 50 cc  Supporting Information: 10/27/14: Hgb= 10.7 10/28/14: Hgb= 9.4 10/29/14: Hgb= 7.2 10/30/14:   Hgb= 10 (2 units PRBC's on 10/29/14)   Please exercise your independent, professional judgment when responding. A specific answer is not anticipated or expected.   Thank You,  Cherylann Ratel [employer] [facility] [place]

## 2014-10-30 NOTE — Progress Notes (Signed)
Pt. Alert but confused. VSS. Incontinent of bowel and bladder. Pts. Been up most of the night yelling and moaning of pain in her right hip. Repositioned and pain meds administered per MAR. Rested for a short while after pain meds administered but woke up soon after yelling again. Pills crushed with applesauce. Dressing clean dry and intact. Neurochecks WDL. Pts, depends just changed and she's resting quietly at this time.

## 2014-10-30 NOTE — Progress Notes (Signed)
Physical Therapy Treatment Patient Details Name: Kristen Deleon MRN: [id number] DOB: 02-06-1926 Today's Date: 10/30/2014    History of Present Illness Pt is a 81 yo female who was admitted to [facility] s/p R hip ORIF on 10/28/14 secondary to fall in bathroom    PT Comments    Pt's cognition and participation with PT is better this date, as she is able perform some bed mobility and engage in therex better than yesterday. Transfers and ambulation were deferred this date secondary spiked troponin this AM with no report on EKG available this afternoon before session. Will attempt ambulation tomorrow. Pt has strength, ROM, acute pain, and mobility deficits that will continue to benefit from skilled PT in order for her to return to her PLOF.   Follow Up Recommendations  SNF;Supervision/Assistance - 24 hour     Equipment Recommendations   (TBD)    Recommendations for Other Services       Precautions / Restrictions Precautions Precautions: Fall Restrictions Weight Bearing Restrictions: Yes Other Position/Activity Restrictions: WBAT    Mobility  Bed Mobility Overal bed mobility: Needs Assistance Bed Mobility: Supine to Sit     Supine to sit: Max assist     General bed mobility comments: Pt requires assist for getting trunk to upright as well as assist with LEs. Pt states great deal of pain with mobility and requires encouragement. Needs physical placement of her hands to perform tasks.   Transfers                 General transfer comment:  (Not appropriate this date. Awaiting EKG)  Ambulation/Gait             General Gait Details: Not appropriate this date. Awaiting EKG.    Stairs            Wheelchair Mobility    Modified Rankin (Stroke Patients Only)       Balance Overall balance assessment: History of Falls                                  Cognition Arousal/Alertness: Lethargic Behavior During Therapy: Flat affect Overall Cognitive  Status: History of cognitive impairments - at baseline (Better cognition today than yesterday)                      Exercises Other Exercises Other Exercises: Pt was able to perform bilateral therex x 10 reps with min-mod assist for facilitation of movement. Pt requiring lots of encouragement throughout exercise, as well as simple cues. Exercises performed: ankle pumps, heel slides, hip abduction, SAQ, and UE press into therapist's hands.     General Comments        Pertinent Vitals/Pain Pain Assessment:  (Not quantify or qualify )    Home Living                      Prior Function            PT Goals (current goals can now be found in the care plan section) Acute Rehab PT Goals Patient Stated Goal: Not able to state PT Goal Formulation: Patient unable to participate in goal setting Time For Goal Achievement: 11/12/14 Potential to Achieve Goals: Fair Progress towards PT goals: Progressing toward goals    Frequency  7X/week    PT Plan Current plan remains appropriate    Co-evaluation  End of Session   Activity Tolerance: Patient limited by pain (limited by cognition) Patient left: in bed;with bed alarm set;with call bell/phone within reach;with SCD's reapplied     Time: 1610-9604 PT Time Calculation (min) (ACUTE ONLY): 24 min  Charges:                       G CodesBenna Dunks 16-Nov-2014, 3:34 PM  Benna Dunks, SPT. [phone]

## 2014-10-30 NOTE — Progress Notes (Signed)
Pts. Troponin level came back as 0.22. Dr. Bethann Punches paged.

## 2014-10-30 NOTE — Progress Notes (Signed)
Patient ID: Kristen Deleon, female   DOB: [birth year], 81 y.o.   MRN: [id number] Patient ID: Kristen Deleon, female   DOB: 02-06-1926, 81 y.o.   MRN: [id number] Kristen Deleon is a 81 y.o. female   SUBJECTIVE:  Patient with long-standing severe dementia admitted with right hip fracture. Patient with no complaints this morning, blood pressure has been running somewhat elevated  ______________________________________________________________________  ROS: Review of systems is unremarkable for any active cardiac,respiratory, GI, GU, hematologic, neurologic or psychiatric systems, 10 systems reviewed.  . cholecalciferol  2,000 Units Oral Daily  . divalproex  125 mg Oral Daily  . divalproex  250 mg Oral QHS  . docusate sodium  100 mg Oral BID  . enoxaparin (LOVENOX) injection  40 mg Subcutaneous Q24H  . feeding supplement (ENSURE ENLIVE)  237 mL Oral TID WC  . LORazepam  0.5 mg Oral BID  . metoprolol tartrate  25 mg Oral TID  . mirtazapine  7.5 mg Oral QHS  . pantoprazole  40 mg Oral BID AC  . potassium chloride  10 mEq Oral BID  . pravastatin  40 mg Oral QHS  . risperiDONE  0.25 mg Oral BID  . vitamin C  500 mg Oral Daily   acetaminophen **OR** acetaminophen, bisacodyl, HYDROmorphone (DILAUDID) injection, magnesium hydroxide, metoCLOPramide **OR** metoCLOPramide (REGLAN) injection, ondansetron **OR** ondansetron (ZOFRAN) IV, ondansetron (ZOFRAN) IV, oxyCODONE-acetaminophen, polyethylene glycol, sodium phosphate   Past Medical History  Diagnosis Date  . Dementia   . Depressive psychosis   . Anxiety   . Insomnia   . Epilepsia     Past Surgical History  Procedure Laterality Date  . Intramedullary (im) nail intertrochanteric Right 10/28/2014    Procedure: INTRAMEDULLARY (IM) NAIL INTERTROCHANTRIC;  Surgeon: Christena Flake, MD;  Location: [facility] ORS;  Service: Orthopedics;  Laterality: Right;    PHYSICAL EXAM:  BP 181/50 mmHg  Pulse 84  Temp(Src) 97.9 F (36.6 C) (Oral)  Resp 14  Ht 5'  4" (1.626 m)  Wt 48.3 kg (106 lb 7.7 oz)  BMI 18.27 kg/m2  SpO2 98%  Wt Readings from Last 3 Encounters:  10/27/14 48.3 kg (106 lb 7.7 oz)           BP Readings from Last 3 Encounters:  10/30/14 181/50    Constitutional: NAD Neck: supple, no thyromegaly Respiratory: CTA, no rales or wheezes Cardiovascular: RRR, no murmur, no gallop Abdomen: soft, good BS, nontender Extremities: no edema Neuro: Alert and conversant  ASSESSMENT/PLAN:  Labs and imaging studies were reviewed  Right hip fracture-postop, stable, prefer very low-dose narcotics versus Tylenol for pain  Dementia-Alzheimer's type, moderate to severe, lives in assisted living Seizure disorder-on Depakote Elevated troponin-check EKG, add metoprolol, denies chest discomfort, will lower blood pressure Anemia-better post transfusion Hypokalemia-replace Skilled nursing tomorrow

## 2014-10-30 NOTE — Discharge Instructions (Signed)

## 2014-10-30 NOTE — Progress Notes (Signed)
Dr. Dan Humphreys paged to be notified of troponin level.

## 2014-10-30 NOTE — Progress Notes (Signed)
Plan is for patient to D/C to [facility] tomorrow 10/31/14. Patient's son Chrissie Noa is aware of above. Kim admissions coordinator at [facility] is aware of above. Clinical Social Worker (CSW) will continue to follow and assist as needed.   Jetta Lout, LCSWA [phone]

## 2014-10-31 LAB — CBC WITH DIFFERENTIAL/PLATELET
BASOS ABS: 0 10*3/uL (ref 0–0.1)
Basophils Relative: 0 %
EOS ABS: 0 10*3/uL (ref 0–0.7)
Eosinophils Relative: 1 %
HCT: 25.5 % — ABNORMAL LOW (ref 35.0–47.0)
HEMOGLOBIN: 8.6 g/dL — AB (ref 12.0–16.0)
LYMPHS ABS: 0.8 10*3/uL — AB (ref 1.0–3.6)
Lymphocytes Relative: 10 %
MCH: 31.7 pg (ref 26.0–34.0)
MCHC: 33.8 g/dL (ref 32.0–36.0)
MCV: 93.9 fL (ref 80.0–100.0)
Monocytes Absolute: 0.8 10*3/uL (ref 0.2–0.9)
Monocytes Relative: 10 %
NEUTROS PCT: 79 %
Neutro Abs: 6.3 10*3/uL (ref 1.4–6.5)
PLATELETS: 120 10*3/uL — AB (ref 150–440)
RBC: 2.71 MIL/uL — AB (ref 3.80–5.20)
RDW: 15 % — ABNORMAL HIGH (ref 11.5–14.5)
WBC: 8 10*3/uL (ref 3.6–11.0)

## 2014-10-31 LAB — BASIC METABOLIC PANEL
ANION GAP: 6 (ref 5–15)
BUN: 26 mg/dL — ABNORMAL HIGH (ref 6–20)
CALCIUM: 8.2 mg/dL — AB (ref 8.9–10.3)
CHLORIDE: 113 mmol/L — AB (ref 101–111)
CO2: 26 mmol/L (ref 22–32)
Creatinine, Ser: 0.69 mg/dL (ref 0.44–1.00)
GFR calc non Af Amer: >60 mL/min (ref >60)
Glucose, Bld: 100 mg/dL — ABNORMAL HIGH (ref 65–99)
Potassium: 3.7 mmol/L (ref 3.5–5.1)
SODIUM: 145 mmol/L (ref 135–145)

## 2014-10-31 LAB — MAGNESIUM: MAGNESIUM: 2.1 mg/dL (ref 1.7–2.4)

## 2014-10-31 MED ORDER — ASPIRIN EC 81 MG PO TBEC
81.0000 mg | DELAYED_RELEASE_TABLET | Freq: Every day | ORAL | Status: DC
  Administered 2014-10-31: 81 mg via ORAL
  Filled 2014-10-31: qty 1

## 2014-10-31 MED ORDER — FERROUS GLUCONATE 324 (38 FE) MG PO TABS: 324.0000 mg | ORAL_TABLET | Freq: Every day | ORAL | Status: AC

## 2014-10-31 NOTE — Progress Notes (Signed)
Report called to Melody at facility. EMS called. Waiting on transportation

## 2014-10-31 NOTE — Discharge Summary (Signed)
Kristen Deleon, is a 81 y.o. female  DOB 12-10-1925  MRN [id number].  Admission date:  10/27/2014  Admitting Physician  No admitting provider for patient encounter.  Discharge Date:  10/31/2014    Admission Diagnosis  Hip fracture, right, closed, initial encounter [S72.001A]  Discharge Diagnoses   Right hip fracture Elevated troponin from demand ischemia Acute blood loss anemia Hypertension Dementia, severe, Alzheimer's type   Past Medical History  Diagnosis Date  . Dementia   . Depressive psychosis   . Anxiety   . Insomnia   . Epilepsia     Past Surgical History  Procedure Laterality Date  . Intramedullary (im) nail intertrochanteric Right 10/28/2014    Procedure: INTRAMEDULLARY (IM) NAIL INTERTROCHANTRIC;  Surgeon: Christena Flake, MD;  Location: [facility] ORS;  Service: Orthopedics;  Laterality: Right;       History of present illness and  Hospital Course:     Kindly see H&P for history of present illness and admission details, please review complete Labs, Consult reports and Test reports for all details in brief  HPI  from the history and physical done on the day of admission    Hospital Course    Patient was admitted and underwent right hip surgery which she tolerated well. She had significant bruising in her pelvic area with repeated drop in her hemoglobin with transfusion 1. With her current drops in her hemoglobin, Lovenox was discontinued and she will be on baby aspirin postop with follow-up  hemoglobin. Mentally she stayed at her baseline of severe dementia. Urinalysis normal. Her troponin bumped to 0.2, EKG unremarkable and metoprolol was started, she denied chest discomfort.   Discharge Condition: Stable   Follow UP  Dr. Hyacinth Meeker 3 weeks    Discharge Instructions  and  Discharge Medications   Tylenol 650 mg every 6 when necessary pain Aspirin 81 mg daily Dulcolax 10 g  suppository when necessary Vitamin D 2000 u daily Depakote 125 mg a.m., 250 mg at bedtime Ensure 1 can 3 times a day Lasix 20 mg daily Imodium 2 mg every 6 when necessary diarrhea Lorazepam 0.5 mg twice a day Milk of magnesia 30 cc daily when necessary Melatonin 3 mg at bedtime Metoprolol tartrate 25 mg twice a day Mirtazapine 7.5 mg at bedtime MiraLAX 17 g daily Klor-Con 10 medical once daily Pravastatin 40 g at bedtime Ranitidine 150 mg twice a day Risperdal 0.25 mg twice a day Vitamin C 500 mg daily Ferrous sulfate 324 mg daily   Today   Subjective:   Kristen Deleon today is stable, afebrile, no complaints.   Objective:   Blood pressure 161/45, pulse 68, temperature 98.4 F (36.9 C), temperature source Oral, resp. rate 14, height  (1.626 m), weight 48.3 kg (106 lb 7.7 oz), SpO2 96 %.   Exam Awake Alert, Oriented x 3, No new F.N deficits, Normal affect Graham.AT,PERRAL Supple Neck,No JVD, No cervical lymphadenopathy appriciated.  Symmetrical Chest wall movement, Good air movement bilaterally, CTAB RRR,No Gallops,Rubs or new Murmurs, Abd Soft, Non tender,  No rebound. No Cyanosis, Clubbing or edema, No new Rash or bruise  Total Time in preparing paper work, data evaluation and todays exam - 35 minutes  Kristen Deleon F. M.D on 10/31/2014 at 7:45 AM

## 2014-10-31 NOTE — Care Management Important Message (Signed)
Important Message  Patient Details  Name: Kristen Deleon MRN: [id number] Date of Birth: [birth year]   Medicare Important Message Given:  Yes-third notification given    [name] 10/31/2014, 9:15 AM

## 2014-10-31 NOTE — Progress Notes (Signed)
Patient is medically stable for D/C to [facility] today. Per Kim admissions coordinator at [facility] patient is going to room 202-A. RN will call report at [phone] and arrange EMS for transport. Clinical Child psychotherapist (CSW) prepared D/C packet and sent D/C Summary and follow up appoitnments to [employer] via carefinder. CSW contacted patient's son Chrissie Noa and made him aware of above. Per son he completed admissions paper work several days ago. Please reconsult if future social work needs arise. CSW signing off.   Jetta Lout, LCSWA [phone]

## 2014-10-31 NOTE — Progress Notes (Signed)
Pt. Just woke up yelling help and her hip was hurting. Pt. Repositioned and medicated for pain. POD 3. Up to [facility] during the night. Voided clear yellow urine. Dressing clean dry and intact. BM yesterday. Crush meds in applesauce. Resting quietly at this time.

## 2014-10-31 NOTE — Progress Notes (Signed)
Tried to call report again multiple times at facility, still no answer and line is still busy. I tried Merchant [employer] officer of nursing Eulah Citizen at facility, but no answer.

## 2014-10-31 NOTE — Progress Notes (Signed)
Tried calling report to facility multiple times this am, line is busy everytime. Will continue to try to call report.

## 2014-10-31 NOTE — Progress Notes (Signed)
  Subjective: 3 Days Post-Op Procedure(s) (LRB): INTRAMEDULLARY (IM) NAIL INTERTROCHANTRIC (Right) Patient reports pain as better but unable to give a specific response..   Patient is well, and has had no acute complaints or problems Plan is to go Skilled nursing facility after hospital stay. Negative for chest pain and shortness of breath Fever: no, most recent temp 98.4 Gastrointestinal:Negative for nausea and vomiting  Objective: Vital signs in last 24 hours: Temp:  [98.4 F (36.9 C)-100.6 F (38.1 C)] 98.4 F (36.9 C) (09/02 0728) Pulse Rate:  [58-71] 68 (09/02 0728) Resp:  [14-18] 14 (09/02 0728) BP: (123-161)/(36-62) 161/45 mmHg (09/02 0728) SpO2:  [96 %-99 %] 96 % (09/02 0728)  Intake/Output from previous day:  Intake/Output Summary (Last 24 hours) at 10/31/14 0731 Last data filed at 10/31/14 0728  Gross per 24 hour  Intake    480 ml  Output   1700 ml  Net  -1220 ml    Intake/Output this shift:    Labs:  Recent Labs  10/29/14 0710 10/30/14 0549 10/31/14 0531  HGB 7.2* 10.0* 8.6*    Recent Labs  10/30/14 0549 10/31/14 0531  WBC 10.1 8.0  RBC 3.15* 2.71*  HCT 29.2* 25.5*  PLT 106* 120*    Recent Labs  10/30/14 0549 10/31/14 0531  NA 141 145  K 3.2* 3.7  CL 107 113*  CO2 25 26  BUN 17 26*  CREATININE 0.72 0.69  GLUCOSE 121* 100*  CALCIUM 8.5* 8.2*   No results for input(s): LABPT, INR in the last 72 hours.   EXAM General - Patient is Disorganized and Lacking.  Cognitive abilities the same from exam yesterday. Extremity - ABD soft Sensation intact distally Intact pulses distally Incision: dressing C/D/I No cellulitis present Dressing/Incision - clean, dry, no drainage Motor Function - intact, moving foot and toes well on exam.   Past Medical History  Diagnosis Date  . Dementia   . Depressive psychosis   . Anxiety   . Insomnia   . Epilepsia     Assessment/Plan: 3 Days Post-Op Procedure(s) (LRB): INTRAMEDULLARY (IM) NAIL  INTERTROCHANTRIC (Right) Principal Problem:   Intertrochanteric fracture of right femur  Estimated body mass index is 18.27 kg/(m^2) as calculated from the following:   Height as of this encounter:  (1.626 m).   Weight as of this encounter: 48.3 kg (106 lb 7.7 oz). Advance diet Up with therapy   Fever spiked to 100.6 yesterday.  Pt on Rocephin IV this AM. Pt is urinating and has had a BM. Started on metoprolol, BP 161/45 this AM. Incision clean with no discharge. Discharge pending medical clearance via internal medicine.  DVT Prophylaxis - Lovenox, Foot Pumps and TED hose Weight-Bearing as tolerated to right leg  J. Horris Latino, PA-C [facility] 10/31/2014, 7:31 AM

## 2014-10-31 NOTE — Progress Notes (Signed)
Checked to see if pre-authorization would be needed for non-emergent EMS transport.  Per [employer]’s automated system, [phone], patient has an [employer]ARP Medicare Complete Choice PPO policy.  [employer] Medicare PPO plans do not require pre-auth for non-emergent ground transports using service codes A0426 or A0428.   °

## 2014-10-31 NOTE — Progress Notes (Signed)
Dr Hyacinth Meeker notified of urinary retention. Order to insert foley catheter, may discharge to [facility] with foley. Greer Ee

## 2014-10-31 NOTE — Progress Notes (Signed)
Pt. Up to [facility] to void. Bed and bath completed. Pt. C/o right hip pain. Pain med administered. Pt. Resting quietly at this time.

## 2014-10-31 NOTE — Clinical Social Work Placement (Signed)
   CLINICAL SOCIAL WORK PLACEMENT  NOTE  Date:  10/31/2014  Patient Details  Name: Kristen Deleon MRN: [id number] Date of Birth: 01/20/1926  Clinical Social Work is seeking post-discharge placement for this patient at the Skilled  Nursing Facility level of care (*CSW will initial, date and re-position this form in  chart as items are completed):  Yes   Patient/family provided with [place] Clinical Social Work Department's list of facilities offering this level of care within the geographic area requested by the patient (or if unable, by the patient's family).  Yes   Patient/family informed of their freedom to choose among providers that offer the needed level of care, that participate in Medicare, Medicaid or managed care program needed by the patient, have an available bed and are willing to accept the patient.  Yes   Patient/family informed of [place]'s ownership interest in [facility] and [facility], as well as of the fact that they are under no obligation to receive care at these facilities.  PASRR submitted to EDS on 10/28/14     PASRR number received on 10/29/14 (PASARR Received)     Existing PASRR number confirmed on       FL2 transmitted to all facilities in geographic area requested by pt/family on 10/28/14     FL2 transmitted to all facilities within larger geographic area on       Patient informed that his/her managed care company has contracts with or will negotiate with certain facilities, including the following:        Yes   Patient/family informed of bed offers received.  Patient chooses bed at  [facility] )     Physician recommends and patient chooses bed at      Patient to be transferred to  [facility] ) on 10/31/14.  Patient to be transferred to facility by  [facility] )     Patient family notified on 10/31/14 of transfer.  Name of family member notified:   (CSW left voicemail for patient's son Kristen Deleon making him  aware of D/C today. )     PHYSICIAN       Additional Comment:    _______________________________________________ Haig Prophet, LCSW 10/31/2014, 10:53 AM

## 2014-11-01 LAB — URINE CULTURE: CULTURE: NO GROWTH

## 2014-11-06 NOTE — Anesthesia Postprocedure Evaluation (Signed)
  Anesthesia Post-op Note  Patient: Kristen Deleon  Procedure(s) Performed: Procedure(s): INTRAMEDULLARY (IM) NAIL INTERTROCHANTRIC (Right)  Anesthesia type:General, Regional, Spinal  Patient location: PACU  Post pain: Pain level controlled  Post assessment: Post-op Vital signs reviewed, Patient's Cardiovascular Status Stable, Respiratory Function Stable, Patent Airway and No signs of Nausea or vomiting  Post vital signs: Reviewed and stable  Last Vitals:  Filed Vitals:   10/31/14 1152  BP: 157/49  Pulse: 69  Temp: 37.2 C  Resp: 18    Level of consciousness: awake, alert  and patient cooperative  Complications: No apparent anesthesia complications

## 2014-11-07 ENCOUNTER — Other Ambulatory Visit
Admission: RE | Admit: 2014-11-07 | Discharge: 2014-11-07 | Disposition: A | Discharge status: Home / Self Care | Payer: Medicare Other | Source: Ambulatory Visit | Attending: Gerontology | Admitting: Gerontology

## 2014-11-07 DIAGNOSIS — I10 Essential (primary) hypertension: Secondary | ICD-10-CM | POA: Insufficient documentation

## 2014-11-07 DIAGNOSIS — D649 Anemia, unspecified: Secondary | ICD-10-CM | POA: Diagnosis present

## 2014-11-07 DIAGNOSIS — E785 Hyperlipidemia, unspecified: Secondary | ICD-10-CM | POA: Diagnosis not present

## 2014-11-07 LAB — CBC WITH DIFFERENTIAL/PLATELET
Basophils Absolute: 0 10*3/uL (ref 0–0.1)
Basophils Relative: 1 %
EOS PCT: 4 %
Eosinophils Absolute: 0.3 10*3/uL (ref 0–0.7)
HEMATOCRIT: 27.2 % — AB (ref 35.0–47.0)
Hemoglobin: 9 g/dL — ABNORMAL LOW (ref 12.0–16.0)
LYMPHS PCT: 13 %
Lymphs Abs: 0.8 10*3/uL — ABNORMAL LOW (ref 1.0–3.6)
MCH: 31.7 pg (ref 26.0–34.0)
MCHC: 33 g/dL (ref 32.0–36.0)
MCV: 96 fL (ref 80.0–100.0)
MONO ABS: 0.6 10*3/uL (ref 0.2–0.9)
MONOS PCT: 11 %
NEUTROS ABS: 4.2 10*3/uL (ref 1.4–6.5)
Neutrophils Relative %: 71 %
PLATELETS: 268 10*3/uL (ref 150–440)
RBC: 2.84 MIL/uL — ABNORMAL LOW (ref 3.80–5.20)
RDW: 13.9 % (ref 11.5–14.5)
WBC: 5.9 10*3/uL (ref 3.6–11.0)

## 2014-11-07 LAB — TSH: TSH: 3.573 u[IU]/mL (ref 0.350–4.500)

## 2014-11-07 LAB — LIPID PANEL
Cholesterol: 107 mg/dL (ref 0–200)
HDL: 29 mg/dL — ABNORMAL LOW (ref >40)
LDL CALC: 64 mg/dL (ref 0–99)
Total CHOL/HDL Ratio: 3.7 RATIO
Triglycerides: 72 mg/dL (ref <150)
VLDL: 14 mg/dL (ref 0–40)

## 2014-11-07 LAB — COMPREHENSIVE METABOLIC PANEL
ALT: 12 U/L — ABNORMAL LOW (ref 14–54)
ANION GAP: 8 (ref 5–15)
AST: 19 U/L (ref 15–41)
Albumin: 2.5 g/dL — ABNORMAL LOW (ref 3.5–5.0)
Alkaline Phosphatase: 79 U/L (ref 38–126)
BUN: 33 mg/dL — AB (ref 6–20)
CHLORIDE: 103 mmol/L (ref 101–111)
CO2: 26 mmol/L (ref 22–32)
Calcium: 8.3 mg/dL — ABNORMAL LOW (ref 8.9–10.3)
Creatinine, Ser: 1.07 mg/dL — ABNORMAL HIGH (ref 0.44–1.00)
GFR, EST AFRICAN AMERICAN: 53 mL/min — AB (ref >60)
GFR, EST NON AFRICAN AMERICAN: 45 mL/min — AB (ref >60)
Glucose, Bld: 79 mg/dL (ref 65–99)
POTASSIUM: 4.5 mmol/L (ref 3.5–5.1)
Sodium: 137 mmol/L (ref 135–145)
Total Bilirubin: 0.5 mg/dL (ref 0.3–1.2)
Total Protein: 5.1 g/dL — ABNORMAL LOW (ref 6.5–8.1)

## 2014-11-07 LAB — VITAMIN B12: VITAMIN B 12: 440 pg/mL (ref 180–914)

## 2014-11-07 LAB — VALPROIC ACID LEVEL: VALPROIC ACID LVL: 35 ug/mL — AB (ref 50.0–100.0)

## 2014-11-07 LAB — MAGNESIUM: MAGNESIUM: 2.1 mg/dL (ref 1.7–2.4)

## 2014-11-08 LAB — VITAMIN D 25 HYDROXY (VIT D DEFICIENCY, FRACTURES): Vit D, 25-Hydroxy: 53.7 ng/mL (ref 30.0–100.0)

## 2014-11-12 LAB — VITAMIN D 1,25 DIHYDROXY
VITAMIN D 1, 25 (OH) TOTAL: 18 pg/mL
Vitamin D3 1, 25 (OH)2: 17 pg/mL

## 2014-11-29 DEATH — deceased

## 2016-05-09 IMAGING — CT CT HEAD WITHOUT CONTRAST
3 of 5 series · 14 of 47 positions shown, 16 images · non-contrast
Comparison: [DATE]

CLINICAL DATA: Fall, head injury

EXAM:
CT HEAD WITHOUT CONTRAST
CT CERVICAL SPINE WITHOUT CONTRAST
TECHNIQUE: Multidetector CT imaging of the head and cervical spine was
performed following the standard protocol without intravenous
contrast. Multiplanar CT image reconstructions of the cervical spine
were also generated.

[Series 8: sag bone · sagittal · 0.25mm/px · 3 of 77 slices shown]
[im 26/77  brain]
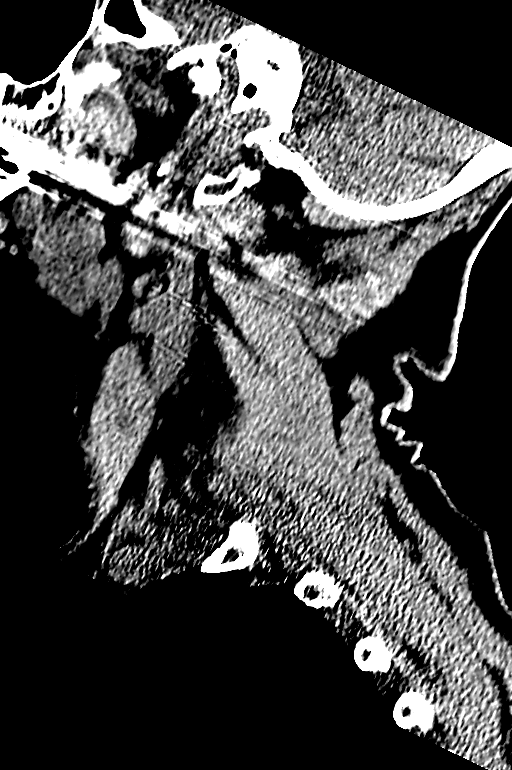
[im 39/77  brain]
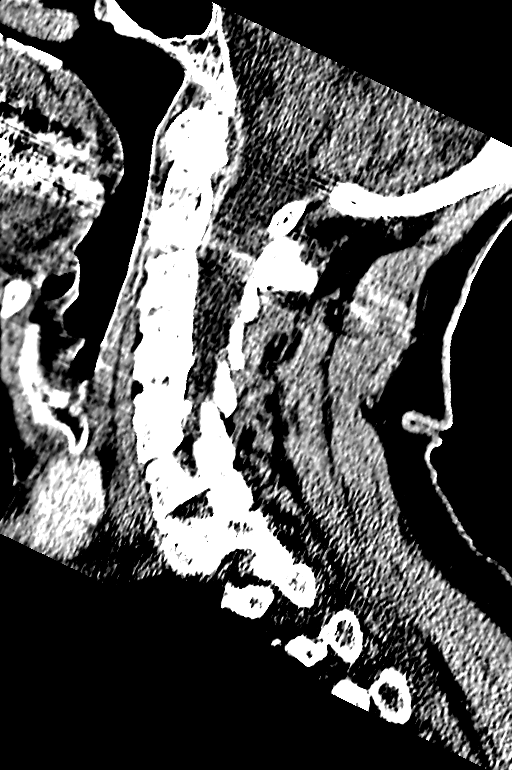
[im 51/77  brain]
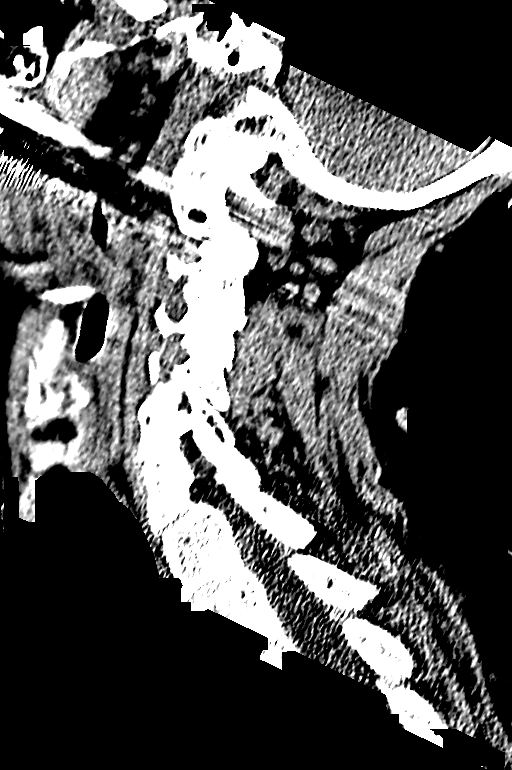

[Series 9: cor bone · coronal · 0.29mm/px · 3 of 75 slices shown]
[im 25/75  brain]
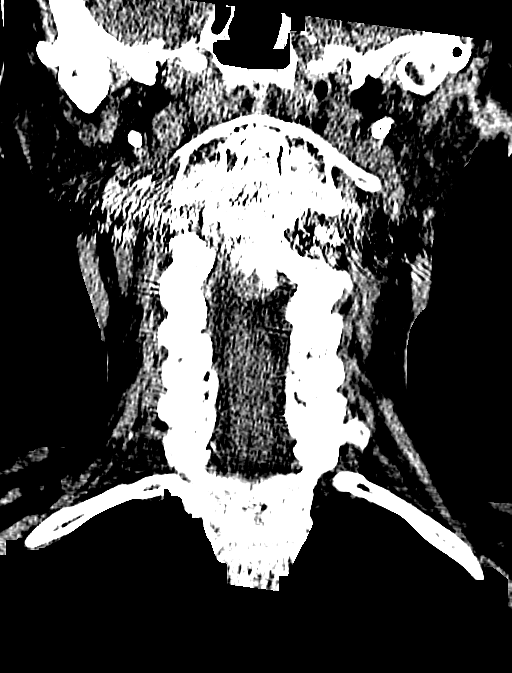
[im 33/75  brain]
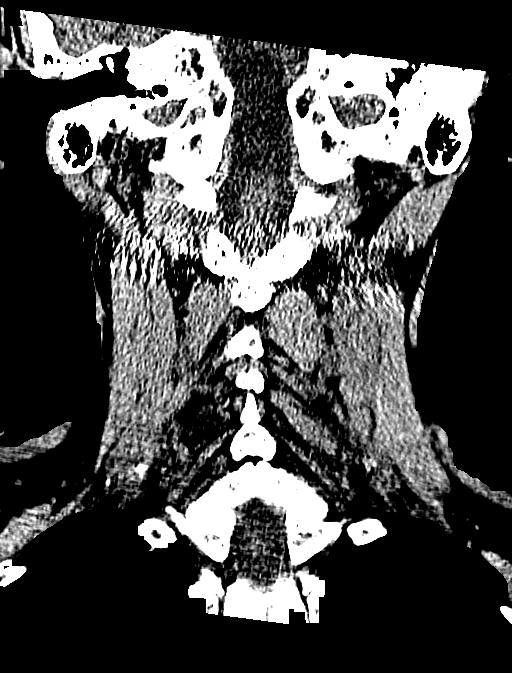
[im 42/75  brain]
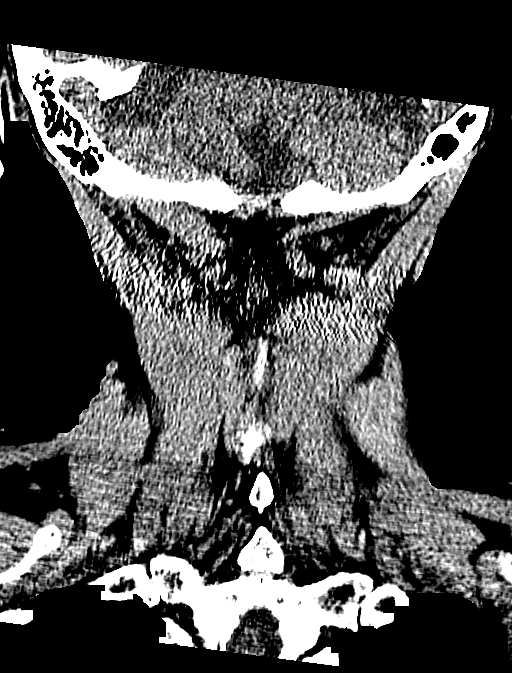

[Series 10: orthogonal axials · axial · 0.29mm/px · z∈[+952,+1080]mm · 8 of 93 slices shown, 10 images]
[im 8/93  brain]
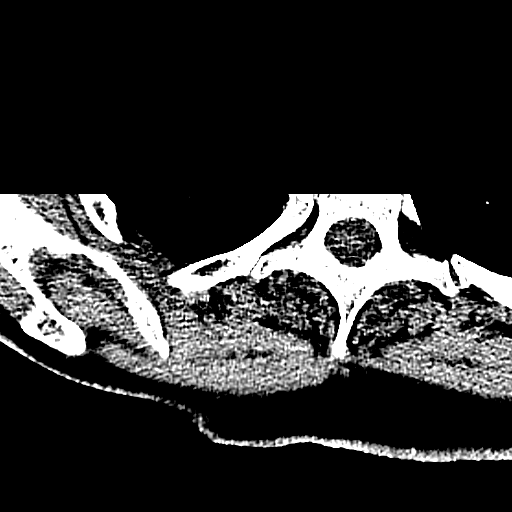
[im 8/93  bone]
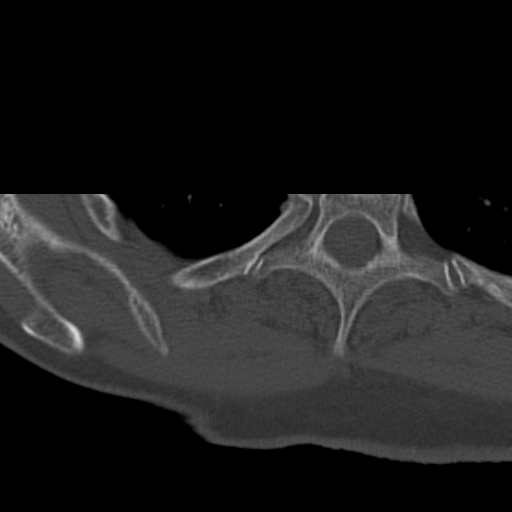
[im 22/93  brain]
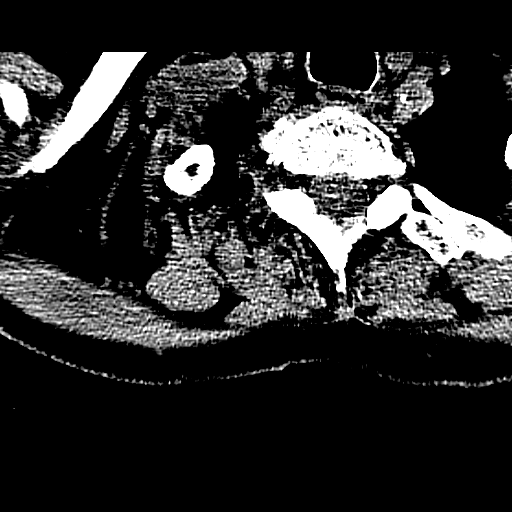
[im 29/93  brain]
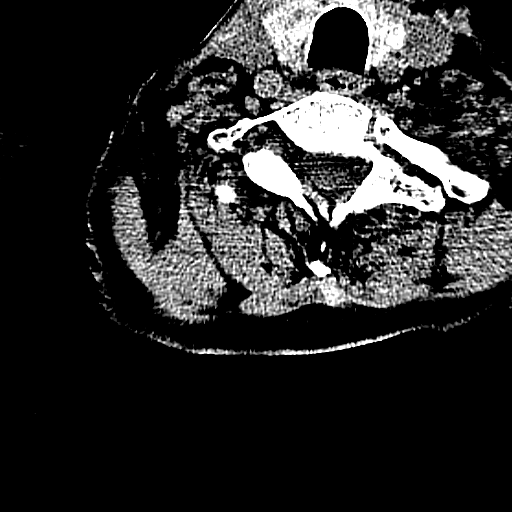
[im 43/93  brain]
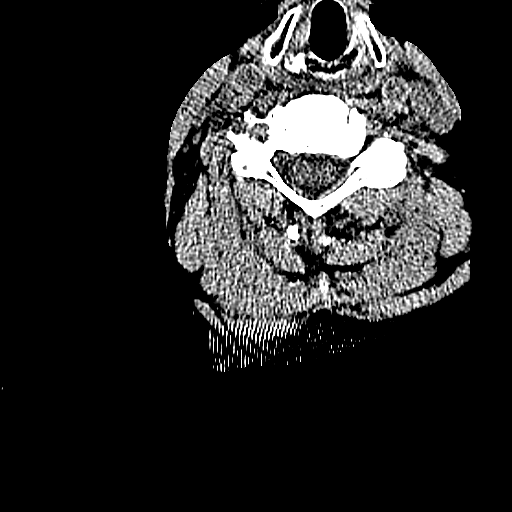
[im 50/93  brain]
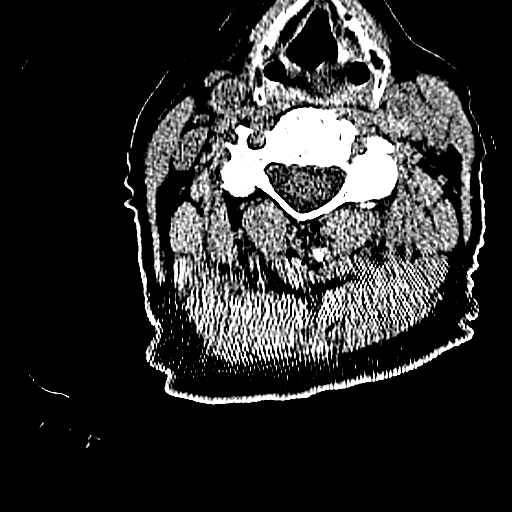
[im 50/93  bone]
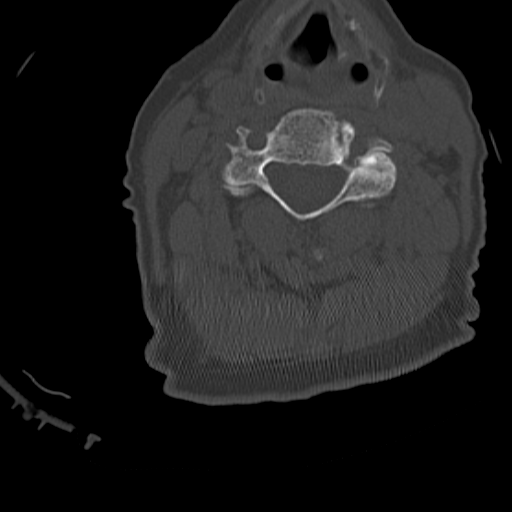
[im 64/93  brain]
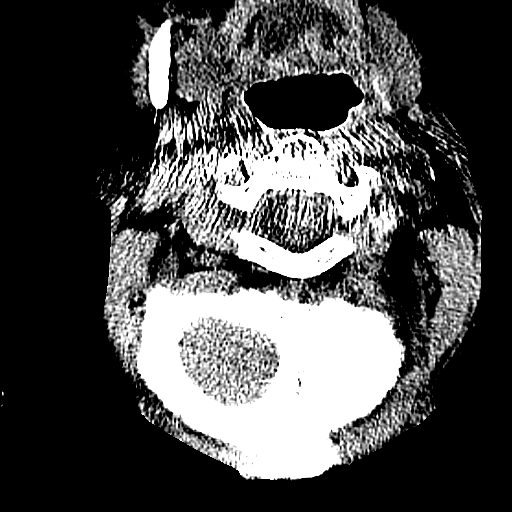
[im 71/93  brain]
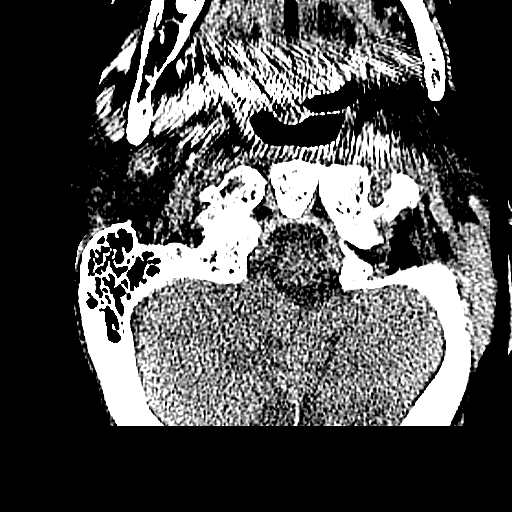
[im 85/93  brain]
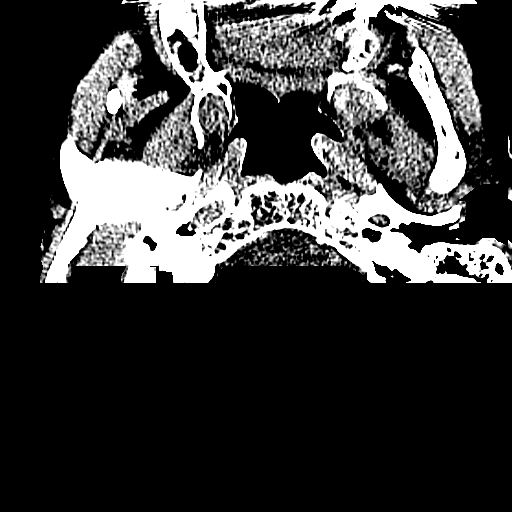

[14 of 47 positions shown; findings below may reference images not displayed]

FINDINGS: CT HEAD FINDINGS

No skull fracture is noted. Paranasal sinuses and mastoid air cells
are unremarkable.

No intracranial hemorrhage, mass effect or midline shift.

Moderate cerebral atrophy again noted. Mild atherosclerotic
calcifications of carotid siphon.

Stable periventricular and patchy subcortical chronic white matter
disease. No acute cortical infarction. No mass lesion is noted on
this unenhanced scan. Stable calvarial hyperostosis. Ventricular
size is stable from prior exam.

CT CERVICAL SPINE FINDINGS

Axial images of the cervical spine shows no acute fracture or
subluxation. Again noted degenerative changes C1-C2 articulation.
Computer processed images shows no acute fracture or subluxation.
There is no prevertebral soft tissue swelling. Cervical airway is
patent. Mild disc space flattening at C3-C4 level. There is disc
space flattening with vacuum disc phenomenon at C4-C5-C5-C6 and
C6-C7 level. Mild posterior spurring at C4-C5 and C6-C7 level.

There is no evidence of pneumothorax in visualized lung apices.
IMPRESSION: 1. No acute intracranial abnormality. Stable atrophy and chronic
white matter disease. Ventricular size is stable from prior exam.
2. No cervical spine acute fracture or subluxation. Stable
degenerative changes as described above.

## 2016-05-15 IMAGING — CR DG CHEST 1V PORT
1 series · 1 of 1 positions shown · non-contrast
Comparison: None.

CLINICAL DATA: Fall.  Initial encounter.

EXAM:
PORTABLE CHEST - 1 VIEW

[dxr portable chest single view]
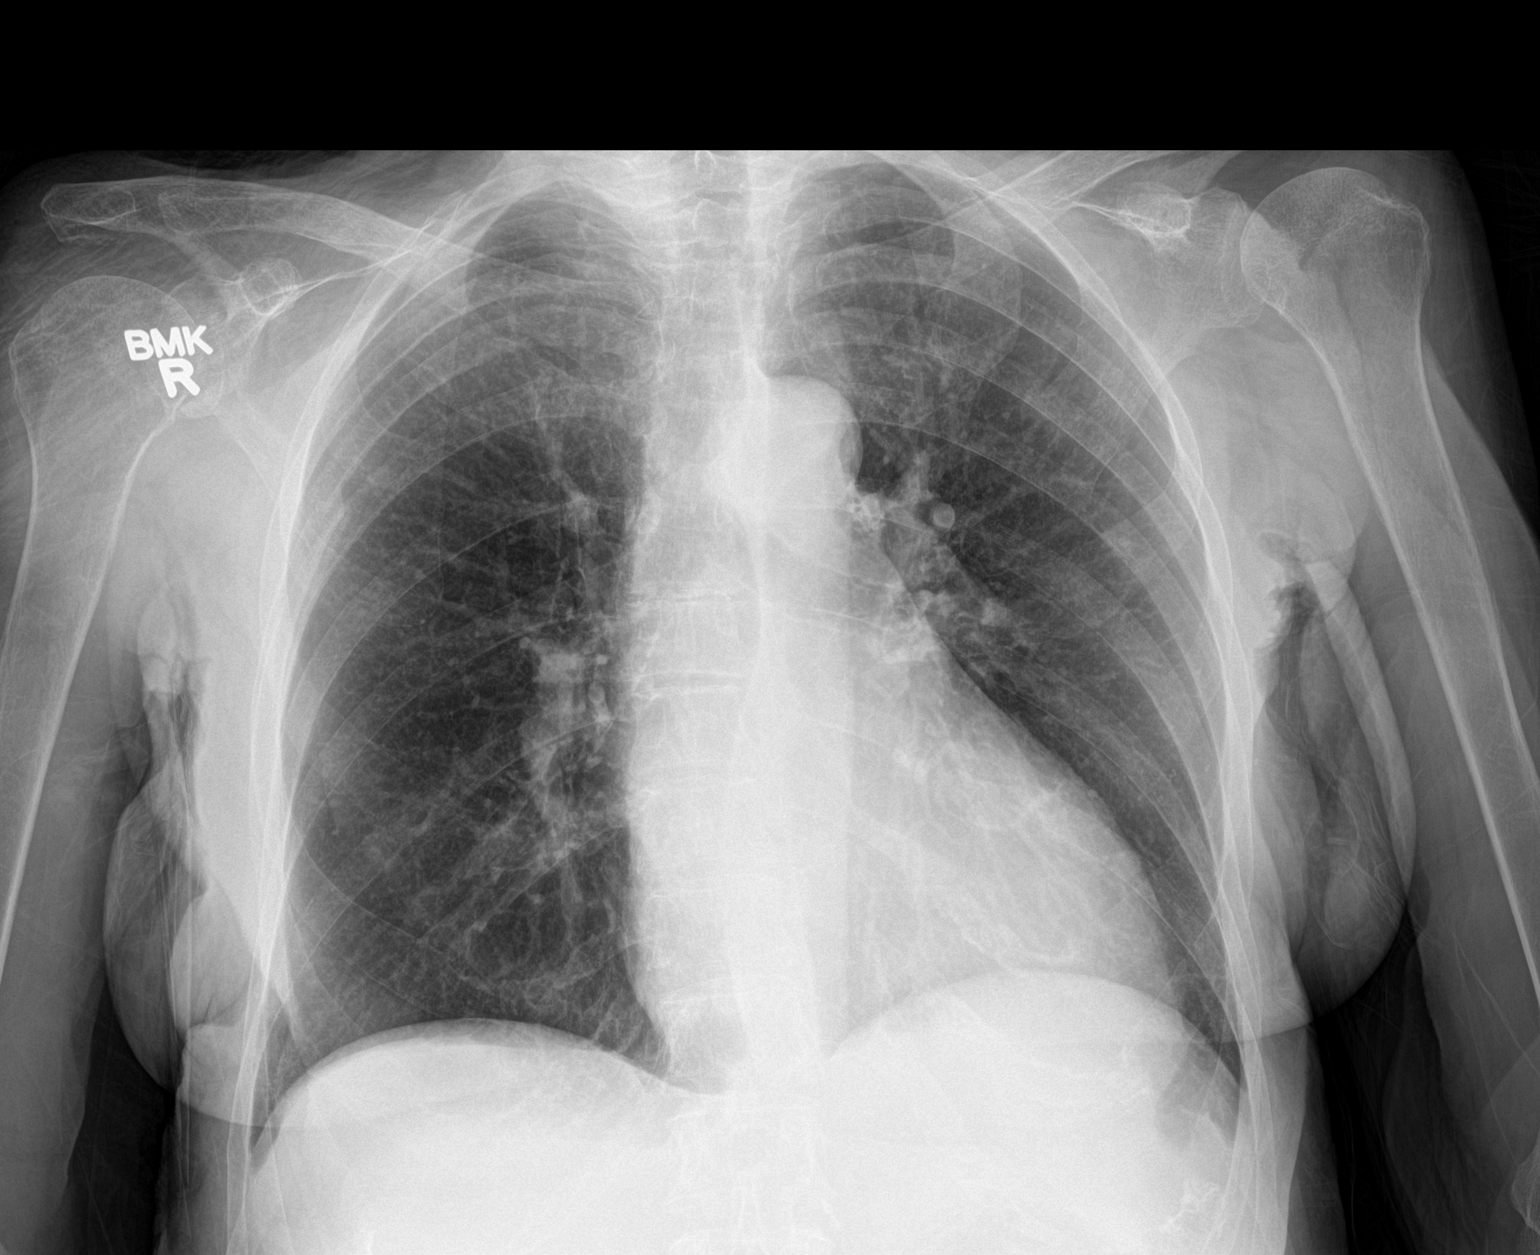

[1 of 1 positions shown; findings below may reference images not displayed]

FINDINGS: Cardiopericardial silhouette within normal limits. Mediastinal
contours normal. Trachea midline. No airspace disease or effusion.
Lucency is present in the proximal LEFT humerus, most compatible
with skin folds. No pneumothorax. No displaced rib fractures.
IMPRESSION: No active disease.
